# Patient Record
Sex: Female | Born: 1970 | Race: White | Hispanic: No | Marital: Married | Smoking: Current every day smoker
Health system: Southern US, Community
[De-identification: ages and names within clinical notes are randomized; demographics above are authoritative.]

## PROBLEM LIST (undated history)

## (undated) DIAGNOSIS — I10 Essential (primary) hypertension: Secondary | ICD-10-CM

## (undated) DIAGNOSIS — Z720 Tobacco use: Secondary | ICD-10-CM

---

## 2022-01-27 ENCOUNTER — Inpatient Hospital Stay (HOSPITAL_COMMUNITY): Payer: Medicaid Other

## 2022-01-27 ENCOUNTER — Inpatient Hospital Stay (HOSPITAL_COMMUNITY)
Admission: AD | Admit: 2022-01-27 | Discharge: 2022-01-31 | DRG: 915 | Disposition: A | Discharge status: Home / Self Care | Payer: Medicaid Other | Source: Other Acute Inpatient Hospital | Attending: Neurology | Admitting: Neurology

## 2022-01-27 ENCOUNTER — Encounter (HOSPITAL_COMMUNITY): Payer: Self-pay | Admitting: Neurology

## 2022-01-27 DIAGNOSIS — E669 Obesity, unspecified: Secondary | ICD-10-CM | POA: Diagnosis present

## 2022-01-27 DIAGNOSIS — T45615A Adverse effect of thrombolytic drugs, initial encounter: Secondary | ICD-10-CM | POA: Diagnosis present

## 2022-01-27 DIAGNOSIS — Z6831 Body mass index (BMI) 31.0-31.9, adult: Secondary | ICD-10-CM

## 2022-01-27 DIAGNOSIS — Z9189 Other specified personal risk factors, not elsewhere classified: Secondary | ICD-10-CM

## 2022-01-27 DIAGNOSIS — Y848 Other medical procedures as the cause of abnormal reaction of the patient, or of later complication, without mention of misadventure at the time of the procedure: Secondary | ICD-10-CM | POA: Diagnosis present

## 2022-01-27 DIAGNOSIS — Y92239 Unspecified place in hospital as the place of occurrence of the external cause: Secondary | ICD-10-CM | POA: Diagnosis present

## 2022-01-27 DIAGNOSIS — Z9911 Dependence on respirator [ventilator] status: Secondary | ICD-10-CM | POA: Diagnosis not present

## 2022-01-27 DIAGNOSIS — H532 Diplopia: Secondary | ICD-10-CM | POA: Diagnosis present

## 2022-01-27 DIAGNOSIS — J9601 Acute respiratory failure with hypoxia: Secondary | ICD-10-CM | POA: Diagnosis present

## 2022-01-27 DIAGNOSIS — R29810 Facial weakness: Secondary | ICD-10-CM | POA: Diagnosis present

## 2022-01-27 DIAGNOSIS — F1210 Cannabis abuse, uncomplicated: Secondary | ICD-10-CM | POA: Diagnosis present

## 2022-01-27 DIAGNOSIS — I639 Cerebral infarction, unspecified: Secondary | ICD-10-CM | POA: Diagnosis present

## 2022-01-27 DIAGNOSIS — T782XXA Anaphylactic shock, unspecified, initial encounter: Secondary | ICD-10-CM | POA: Diagnosis not present

## 2022-01-27 DIAGNOSIS — T886XXA Anaphylactic reaction due to adverse effect of correct drug or medicament properly administered, initial encounter: Principal | ICD-10-CM | POA: Diagnosis present

## 2022-01-27 DIAGNOSIS — Z720 Tobacco use: Secondary | ICD-10-CM | POA: Diagnosis present

## 2022-01-27 DIAGNOSIS — I952 Hypotension due to drugs: Secondary | ICD-10-CM | POA: Diagnosis present

## 2022-01-27 DIAGNOSIS — J441 Chronic obstructive pulmonary disease with (acute) exacerbation: Secondary | ICD-10-CM | POA: Diagnosis present

## 2022-01-27 DIAGNOSIS — J9600 Acute respiratory failure, unspecified whether with hypoxia or hypercapnia: Secondary | ICD-10-CM

## 2022-01-27 DIAGNOSIS — E039 Hypothyroidism, unspecified: Secondary | ICD-10-CM | POA: Diagnosis present

## 2022-01-27 DIAGNOSIS — E785 Hyperlipidemia, unspecified: Secondary | ICD-10-CM | POA: Diagnosis present

## 2022-01-27 DIAGNOSIS — R262 Difficulty in walking, not elsewhere classified: Secondary | ICD-10-CM | POA: Diagnosis present

## 2022-01-27 DIAGNOSIS — Z8679 Personal history of other diseases of the circulatory system: Secondary | ICD-10-CM

## 2022-01-27 DIAGNOSIS — G9349 Other encephalopathy: Secondary | ICD-10-CM | POA: Diagnosis present

## 2022-01-27 DIAGNOSIS — J9690 Respiratory failure, unspecified, unspecified whether with hypoxia or hypercapnia: Secondary | ICD-10-CM | POA: Diagnosis present

## 2022-01-27 DIAGNOSIS — R4701 Aphasia: Secondary | ICD-10-CM | POA: Diagnosis present

## 2022-01-27 DIAGNOSIS — F329 Major depressive disorder, single episode, unspecified: Secondary | ICD-10-CM | POA: Diagnosis present

## 2022-01-27 DIAGNOSIS — Z886 Allergy status to analgesic agent status: Secondary | ICD-10-CM

## 2022-01-27 DIAGNOSIS — Z888 Allergy status to other drugs, medicaments and biological substances status: Secondary | ICD-10-CM | POA: Diagnosis not present

## 2022-01-27 DIAGNOSIS — I6389 Other cerebral infarction: Secondary | ICD-10-CM | POA: Diagnosis not present

## 2022-01-27 DIAGNOSIS — R29700 NIHSS score 0: Secondary | ICD-10-CM | POA: Diagnosis present

## 2022-01-27 DIAGNOSIS — I10 Essential (primary) hypertension: Secondary | ICD-10-CM | POA: Diagnosis present

## 2022-01-27 DIAGNOSIS — J9621 Acute and chronic respiratory failure with hypoxia: Secondary | ICD-10-CM | POA: Diagnosis not present

## 2022-01-27 DIAGNOSIS — Y829 Unspecified medical devices associated with adverse incidents: Secondary | ICD-10-CM | POA: Diagnosis present

## 2022-01-27 DIAGNOSIS — J449 Chronic obstructive pulmonary disease, unspecified: Secondary | ICD-10-CM | POA: Diagnosis not present

## 2022-01-27 DIAGNOSIS — G463 Brain stem stroke syndrome: Secondary | ICD-10-CM | POA: Diagnosis present

## 2022-01-27 DIAGNOSIS — R42 Dizziness and giddiness: Secondary | ICD-10-CM | POA: Diagnosis not present

## 2022-01-27 HISTORY — DX: Essential (primary) hypertension: I10

## 2022-01-27 HISTORY — DX: Tobacco use: Z72.0

## 2022-01-27 LAB — HEMOGLOBIN A1C
Hgb A1c MFr Bld: 5.5 % (ref 4.8–5.6)
Mean Plasma Glucose: 111.15 mg/dL

## 2022-01-27 LAB — GLUCOSE, CAPILLARY
Glucose-Capillary: 105 mg/dL — ABNORMAL HIGH (ref 70–99)
Glucose-Capillary: 119 mg/dL — ABNORMAL HIGH (ref 70–99)
Glucose-Capillary: 96 mg/dL (ref 70–99)

## 2022-01-27 LAB — LIPID PANEL
Cholesterol: 129 mg/dL (ref 0–200)
HDL: 31 mg/dL — ABNORMAL LOW (ref >40)
LDL Cholesterol: 75 mg/dL (ref 0–99)
Total CHOL/HDL Ratio: 4.2 RATIO
Triglycerides: 113 mg/dL (ref <150)
VLDL: 23 mg/dL (ref 0–40)

## 2022-01-27 LAB — PHOSPHORUS: Phosphorus: 3.6 mg/dL (ref 2.5–4.6)

## 2022-01-27 LAB — MRSA NEXT GEN BY PCR, NASAL: MRSA by PCR Next Gen: NOT DETECTED

## 2022-01-27 LAB — MAGNESIUM: Magnesium: 2.1 mg/dL (ref 1.7–2.4)

## 2022-01-27 IMAGING — DX DG ABD PORTABLE 1V
1 series · 1 of 1 positions shown · non-contrast
Comparison: Chest radiograph same date. No previous comparison
studies.

CLINICAL DATA: Respiratory failure, nasogastric tube present.

EXAM:
PORTABLE ABDOMEN - 1 VIEW

[abdomen]
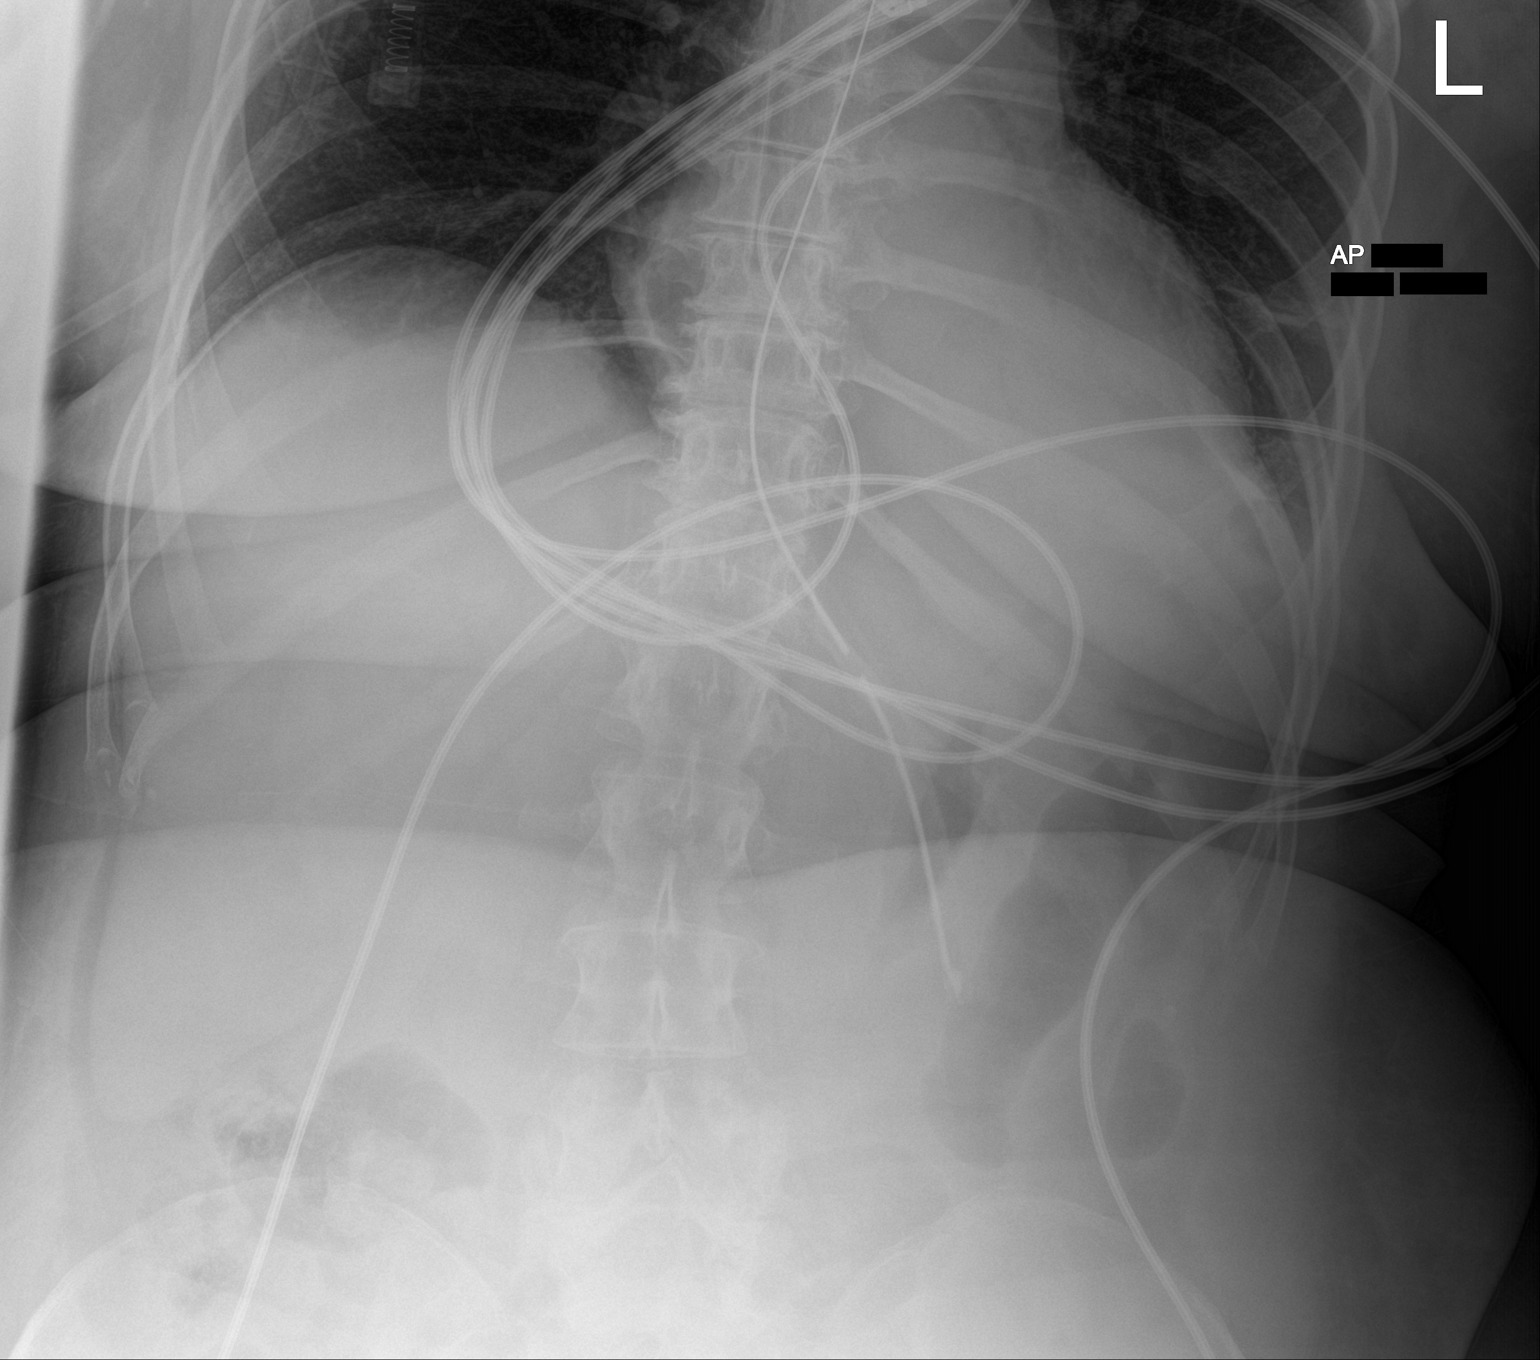

[1 of 1 positions shown; findings below may reference images not displayed]

FINDINGS: [HN] hours. Enteric tube projects over the left mid abdomen
consistent with position in the mid stomach. The visualized bowel
gas pattern is nonobstructive, and there is no evidence of free
intraperitoneal air. As seen on chest radiographs, there is
retrocardiac left lower lobe airspace disease with a possible left
pleural effusion.
IMPRESSION: Enteric tube projects to the level of the mid stomach. Left lower
lobe airspace disease and possible small left pleural effusion.

## 2022-01-27 IMAGING — CT CT HEAD W/O CM
4 series · 16 of 47 positions shown, 18 images · non-contrast
Comparison: None.

CLINICAL DATA: Dizziness and blurry vision, stroke suspected



[Series 2: head wo · axial · 0.39mm/px · z∈[-150,-40]mm · 7 of 30 slices shown, 9 images]
[im 4/30  brain]
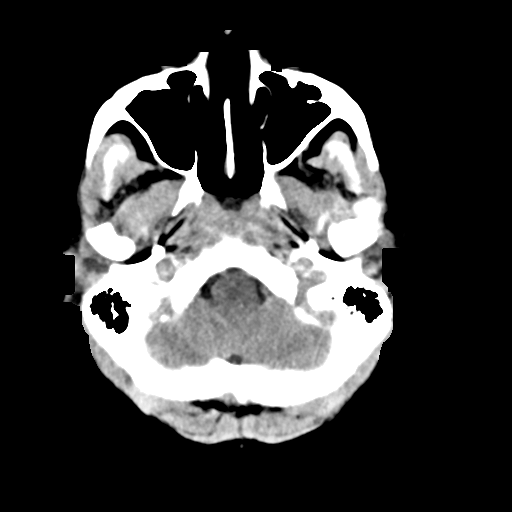
[im 4/30  bone]
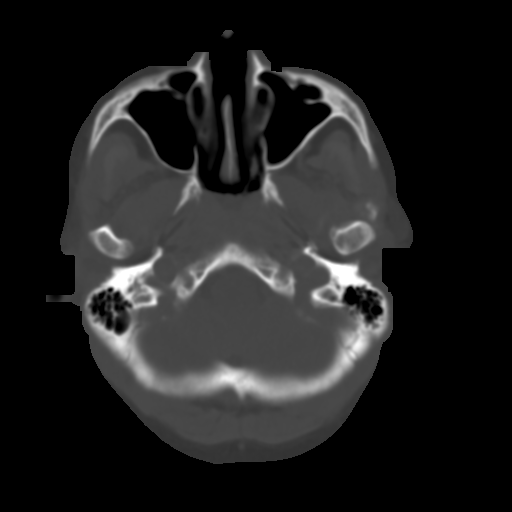
[im 8/30  brain]
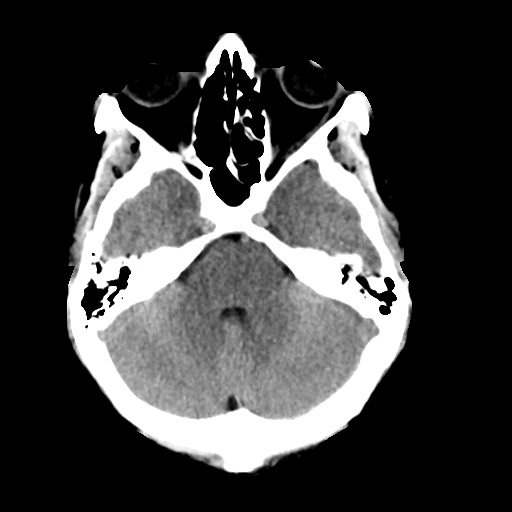
[im 11/30  brain]
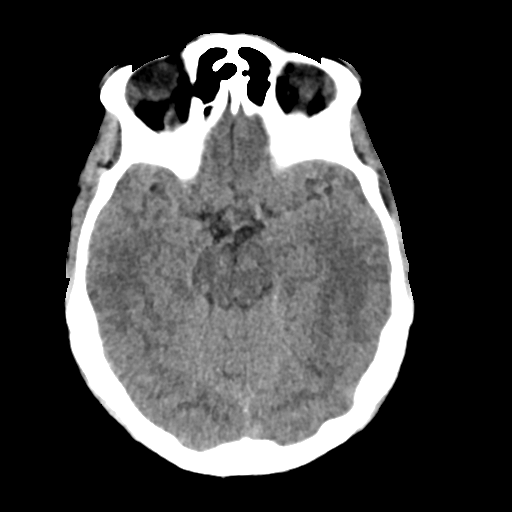
[im 15/30  brain]
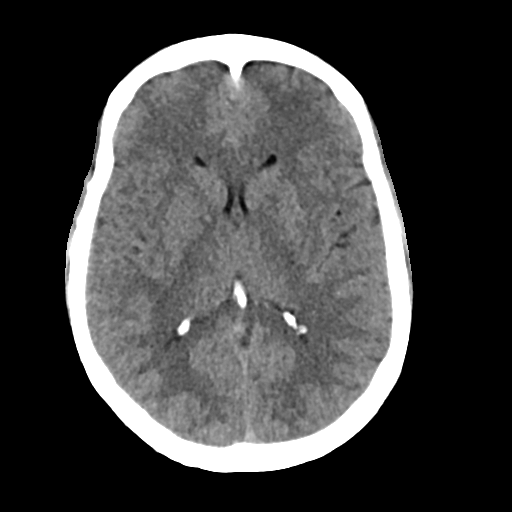
[im 19/30  brain]
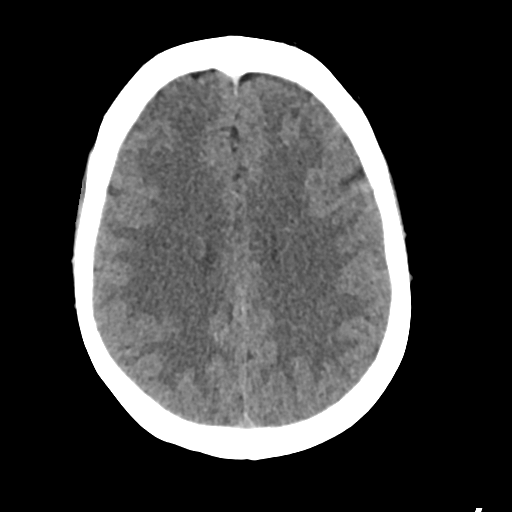
[im 19/30  bone]
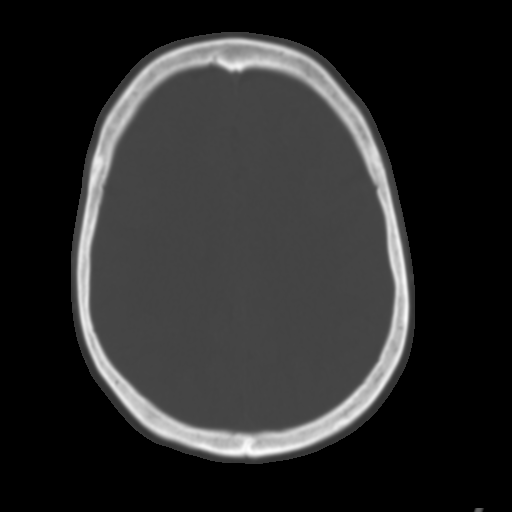
[im 22/30  brain]
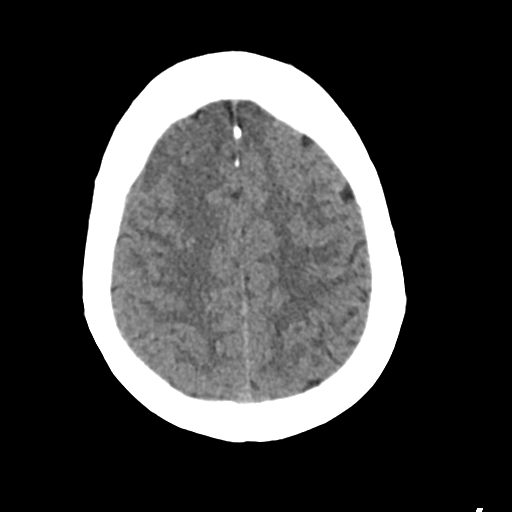
[im 26/30  brain]
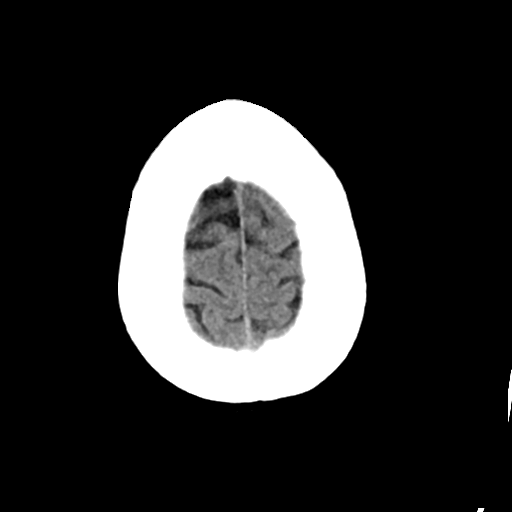

[Series 3: head bone · axial · 0.39mm/px · z∈[-150,-120]mm · 3 of 75 slices shown]
[im 8/75  bone]
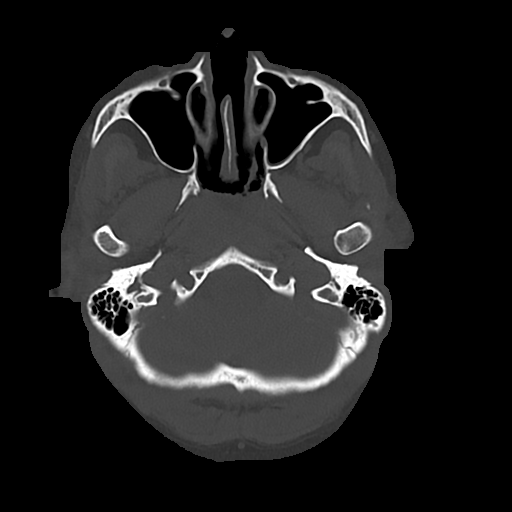
[im 15/75  bone]
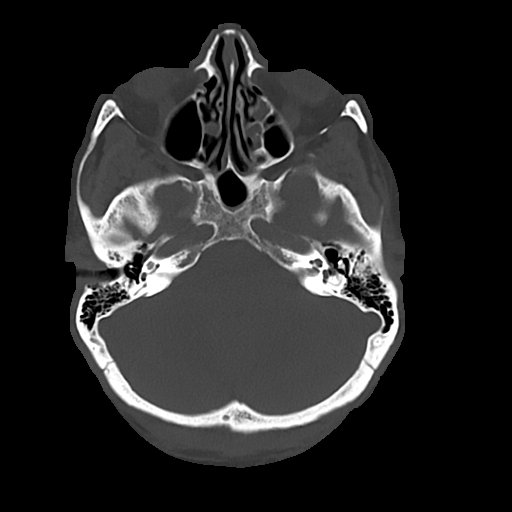
[im 23/75  bone]
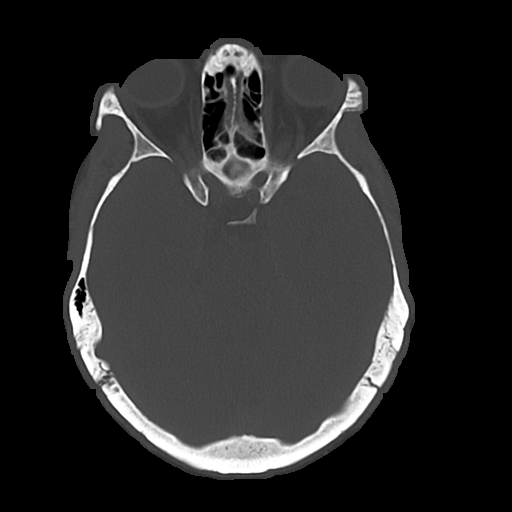

[Series 4: cor soft · coronal · 0.29mm/px · 3 of 64 slices shown]
[im 22/64  brain]
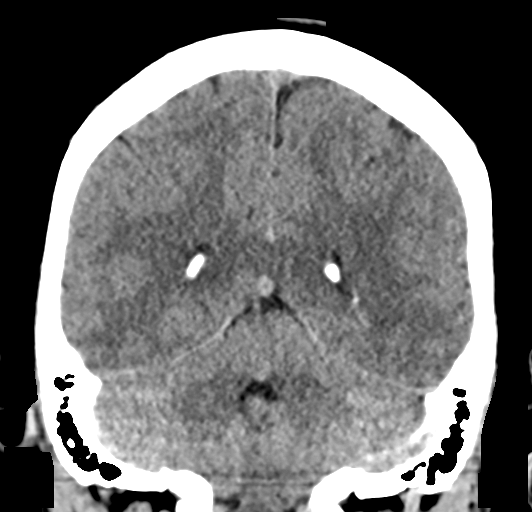
[im 29/64  brain]
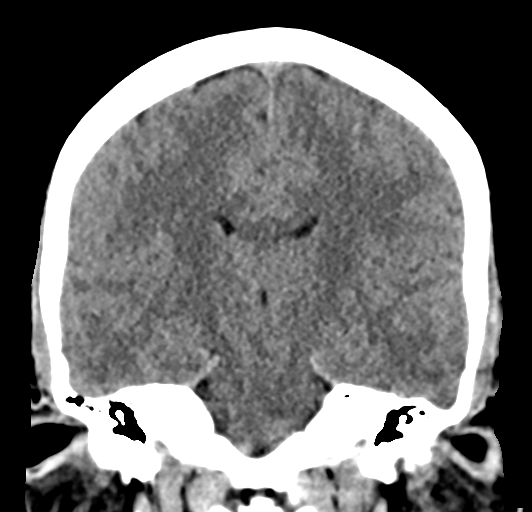
[im 36/64  brain]
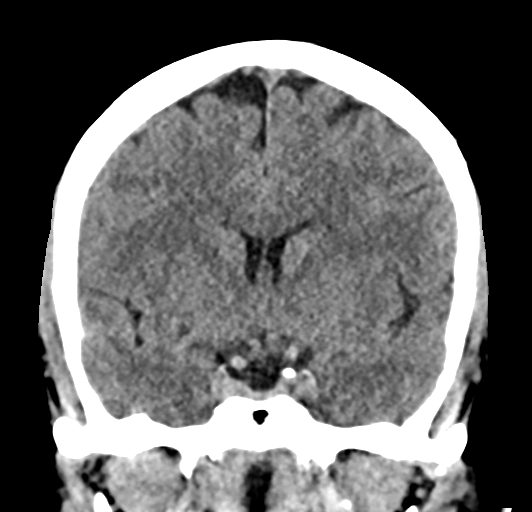

[Series 5: sag soft · sagittal · 0.29mm/px · 3 of 53 slices shown]
[im 18/53  brain]
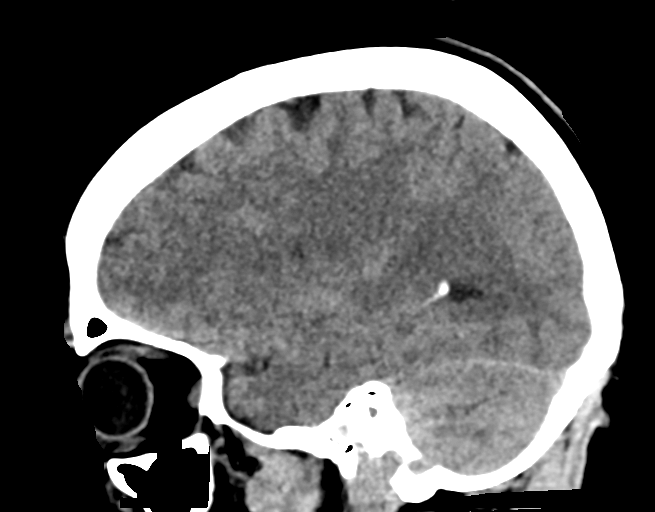
[im 27/53  brain]
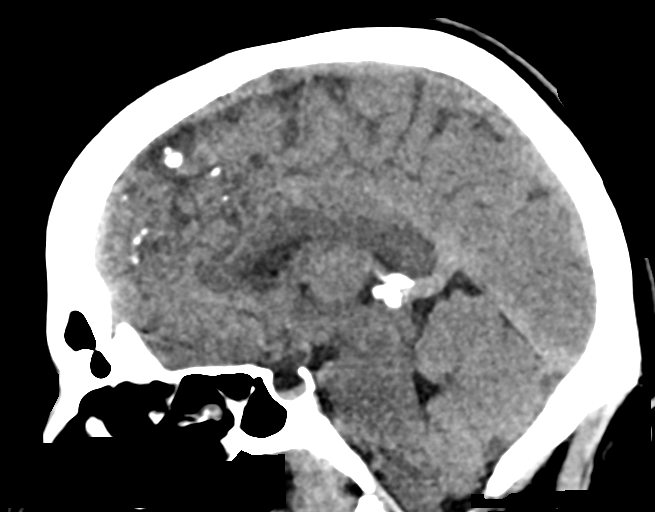
[im 35/53  brain]
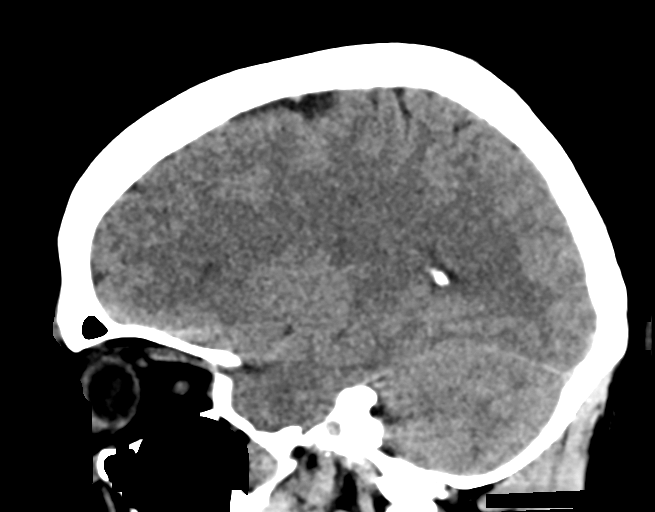

[16 of 47 positions shown; findings below may reference images not displayed]

FINDINGS: Brain: No evidence of acute infarction, hemorrhage, cerebral edema,
mass, mass effect, or midline shift. No hydrocephalus or extra-axial
fluid collection.

Vascular: No hyperdense vessel.

Skull: Normal. Negative for fracture or focal lesion.

Sinuses/Orbits: Minimal mucosal thickening in the right maxillary
sinus and posterior ethmoid air cells. The orbits are unremarkable.

Other: The mastoid air cells are well aerated.
IMPRESSION: IMPRESSION
No acute intracranial process.

## 2022-01-27 IMAGING — DX DG CHEST 1V PORT
1 series · 1 of 1 positions shown · non-contrast
Comparison: None available. Report only from prior chest
radiographs [DATE].

CLINICAL DATA: Respiratory failure.  Nasogastric tube present.

EXAM:
PORTABLE CHEST 1 VIEW

[chest]
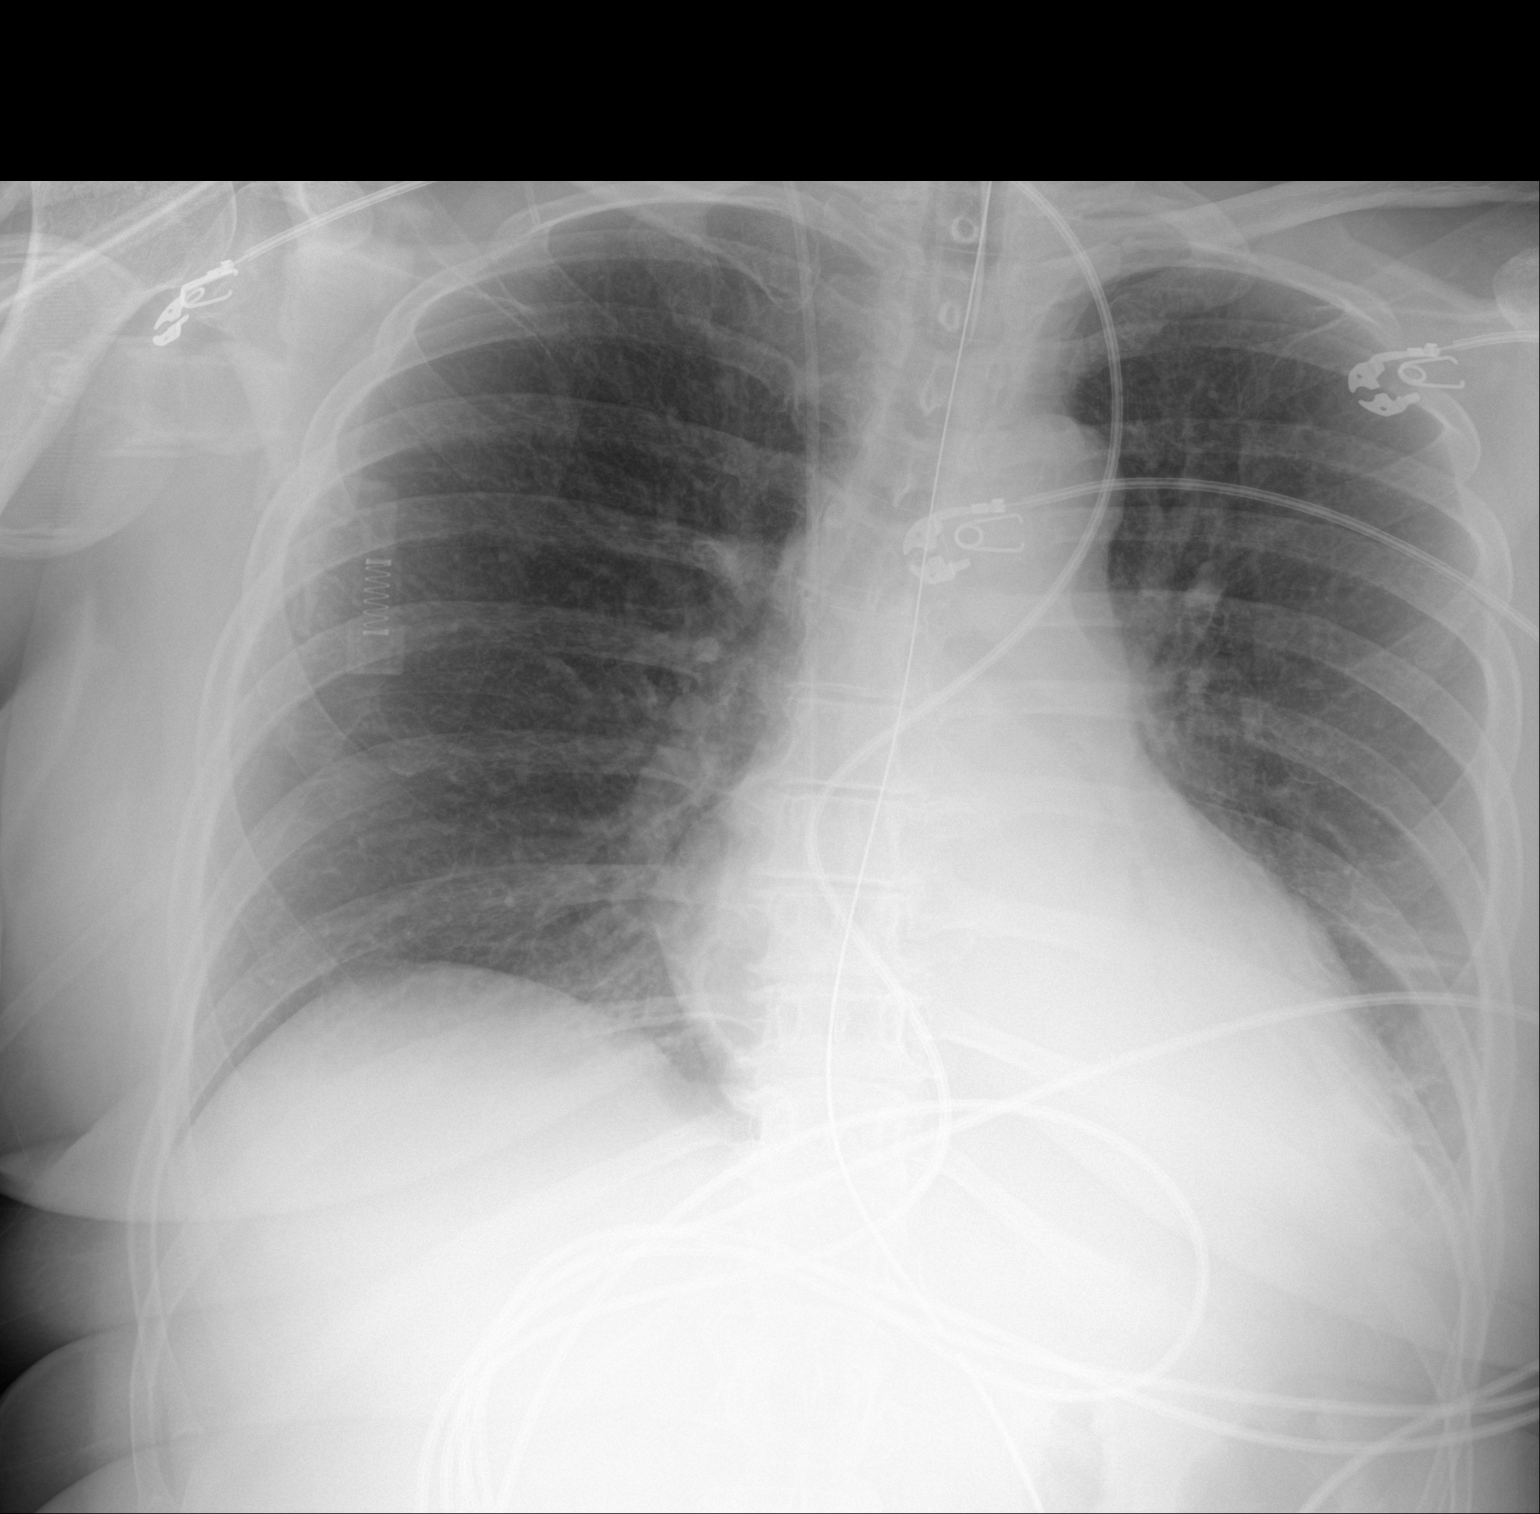

[1 of 1 positions shown; findings below may reference images not displayed]

FINDINGS: [T8] hours. The heart size and mediastinal contours are normal.
Endotracheal tube terminates at the level of the mid trachea.
Enteric tube projects below the diaphragm, with the side hole
projecting over the proximal stomach. There is left lower lobe
airspace disease with associated volume loss and a possible left
pleural effusion. The right lung is clear. No pneumothorax. The
bones appear unremarkable.
IMPRESSION: Satisfactory position of the endotracheal and enteric tubes.

Left lower lobe airspace disease and possible left pleural effusion
suspicious for pneumonia or aspiration. Correlate clinically.
Followup PA and lateral chest X-ray is recommended in 3-4 weeks
following trial of antibiotic therapy to ensure resolution and
exclude underlying malignancy.

## 2022-01-27 MED ORDER — BUDESONIDE 0.25 MG/2ML IN SUSP
0.2500 mg | Freq: Two times a day (BID) | RESPIRATORY_TRACT | Status: DC
  Administered 2022-01-27: 0.25 mg via RESPIRATORY_TRACT
  Filled 2022-01-27 (×4): qty 2

## 2022-01-27 MED ORDER — ONDANSETRON HCL 4 MG/2ML IJ SOLN
4.0000 mg | Freq: Four times a day (QID) | INTRAMUSCULAR | Status: DC
  Administered 2022-01-28 (×2): 4 mg via INTRAVENOUS
  Filled 2022-01-27 (×2): qty 2

## 2022-01-27 MED ORDER — CHLORHEXIDINE GLUCONATE 0.12% ORAL RINSE (MEDLINE KIT)
15.0000 mL | Freq: Two times a day (BID) | OROMUCOSAL | Status: DC
  Administered 2022-01-27 – 2022-01-28 (×2): 15 mL via OROMUCOSAL

## 2022-01-27 MED ORDER — PANTOPRAZOLE 2 MG/ML SUSPENSION: 40.0000 mg | Freq: Every day | ORAL | Status: DC

## 2022-01-27 MED ORDER — VITAL HIGH PROTEIN PO LIQD
1000.0000 mL | ORAL | Status: DC
  Administered 2022-01-27: 1000 mL

## 2022-01-27 MED ORDER — ARFORMOTEROL TARTRATE 15 MCG/2ML IN NEBU
15.0000 ug | INHALATION_SOLUTION | Freq: Two times a day (BID) | RESPIRATORY_TRACT | Status: DC
  Administered 2022-01-27 – 2022-01-29 (×4): 15 ug via RESPIRATORY_TRACT
  Filled 2022-01-27 (×4): qty 2

## 2022-01-27 MED ORDER — REVEFENACIN 175 MCG/3ML IN SOLN
175.0000 ug | Freq: Every day | RESPIRATORY_TRACT | Status: DC
  Administered 2022-01-28 – 2022-01-29 (×2): 175 ug via RESPIRATORY_TRACT
  Filled 2022-01-27 (×2): qty 3

## 2022-01-27 MED ORDER — PROSOURCE TF PO LIQD
45.0000 mL | Freq: Two times a day (BID) | ORAL | Status: DC
  Administered 2022-01-27 – 2022-01-28 (×3): 45 mL
  Filled 2022-01-27 (×3): qty 45

## 2022-01-27 MED ORDER — INSULIN ASPART 100 UNIT/ML IJ SOLN: 0.0000 [IU] | INTRAMUSCULAR | Status: DC

## 2022-01-27 MED ORDER — ASPIRIN 325 MG PO TABS
325.0000 mg | ORAL_TABLET | Freq: Every day | ORAL | Status: DC
  Administered 2022-01-27 – 2022-01-28 (×2): 325 mg
  Filled 2022-01-27 (×3): qty 1

## 2022-01-27 MED ORDER — ROSUVASTATIN CALCIUM 20 MG PO TABS
20.0000 mg | ORAL_TABLET | Freq: Every day | ORAL | Status: DC
  Administered 2022-01-27 – 2022-01-28 (×2): 20 mg
  Filled 2022-01-27 (×2): qty 1

## 2022-01-27 MED ORDER — ALBUTEROL SULFATE (2.5 MG/3ML) 0.083% IN NEBU
2.5000 mg | INHALATION_SOLUTION | RESPIRATORY_TRACT | Status: DC | PRN
  Administered 2022-01-29: 2.5 mg via RESPIRATORY_TRACT
  Filled 2022-01-27: qty 3

## 2022-01-27 MED ORDER — ORAL CARE MOUTH RINSE
15.0000 mL | OROMUCOSAL | Status: DC
  Administered 2022-01-27 – 2022-01-28 (×10): 15 mL via OROMUCOSAL

## 2022-01-27 MED ORDER — ASPIRIN 325 MG PO TABS: 325.0000 mg | ORAL_TABLET | Freq: Every day | ORAL | Status: DC

## 2022-01-27 MED ORDER — ONDANSETRON HCL 4 MG/2ML IJ SOLN
INTRAMUSCULAR | Status: AC
  Administered 2022-01-27: 4 mg via INTRAVENOUS
  Filled 2022-01-27: qty 2

## 2022-01-27 MED ORDER — FENTANYL CITRATE PF 50 MCG/ML IJ SOSY
50.0000 ug | PREFILLED_SYRINGE | INTRAMUSCULAR | Status: DC | PRN
  Administered 2022-01-27: 100 ug via INTRAVENOUS
  Filled 2022-01-27: qty 1
  Filled 2022-01-27: qty 2

## 2022-01-27 MED ORDER — POLYETHYLENE GLYCOL 3350 17 G PO PACK
17.0000 g | PACK | Freq: Every day | ORAL | Status: DC
  Administered 2022-01-27 – 2022-01-28 (×2): 17 g
  Filled 2022-01-27 (×2): qty 1

## 2022-01-27 MED ORDER — NOREPINEPHRINE 16 MG/250ML-% IV SOLN
0.0000 ug/min | INTRAVENOUS | Status: DC
  Administered 2022-01-27: 1 ug/min via INTRAVENOUS
  Filled 2022-01-27: qty 250

## 2022-01-27 MED ORDER — FENTANYL BOLUS VIA INFUSION
50.0000 ug | INTRAVENOUS | Status: DC | PRN
  Administered 2022-01-27 (×2): 50 ug via INTRAVENOUS
  Administered 2022-01-28 (×9): 100 ug via INTRAVENOUS
  Filled 2022-01-27: qty 100

## 2022-01-27 MED ORDER — ROSUVASTATIN CALCIUM 20 MG PO TABS: 20.0000 mg | ORAL_TABLET | Freq: Every day | ORAL | Status: DC

## 2022-01-27 MED ORDER — FENTANYL 2500MCG IN NS 250ML (10MCG/ML) PREMIX INFUSION
50.0000 ug/h | INTRAVENOUS | Status: DC
  Administered 2022-01-27: 50 ug/h via INTRAVENOUS
  Administered 2022-01-28: 200 ug/h via INTRAVENOUS
  Filled 2022-01-27 (×2): qty 250

## 2022-01-27 MED ORDER — FENTANYL CITRATE PF 50 MCG/ML IJ SOSY
50.0000 ug | PREFILLED_SYRINGE | INTRAMUSCULAR | Status: DC | PRN
  Administered 2022-01-27 (×2): 50 ug via INTRAVENOUS
  Filled 2022-01-27: qty 1

## 2022-01-27 MED ORDER — DEXAMETHASONE SODIUM PHOSPHATE 4 MG/ML IJ SOLN: 4.0000 mg | INTRAMUSCULAR | Status: DC

## 2022-01-27 MED ORDER — DOCUSATE SODIUM 50 MG/5ML PO LIQD
100.0000 mg | Freq: Two times a day (BID) | ORAL | Status: DC
  Administered 2022-01-27 – 2022-01-28 (×2): 100 mg
  Filled 2022-01-27 (×2): qty 10

## 2022-01-27 MED ORDER — CHLORHEXIDINE GLUCONATE CLOTH 2 % EX PADS
6.0000 | MEDICATED_PAD | Freq: Every day | CUTANEOUS | Status: DC
  Administered 2022-01-27 – 2022-01-29 (×3): 6 via TOPICAL

## 2022-01-27 MED ORDER — DEXMEDETOMIDINE HCL IN NACL 400 MCG/100ML IV SOLN
0.0000 ug/kg/h | INTRAVENOUS | Status: DC
  Administered 2022-01-27: 0.4 ug/kg/h via INTRAVENOUS
  Filled 2022-01-27: qty 100

## 2022-01-27 MED ORDER — PANTOPRAZOLE 2 MG/ML SUSPENSION
40.0000 mg | Freq: Every day | ORAL | Status: DC
  Administered 2022-01-27 – 2022-01-28 (×2): 40 mg
  Filled 2022-01-27 (×2): qty 20

## 2022-01-27 NOTE — Progress Notes (Addendum)
1830: Patient with emesis x3. NP Anders Simmonds and MD Roda Shutters made aware. Pt coughing significantly. Pt's neuro exam unchanged. Zofran requested. Tube Feeds turned off. See MAR. ? ?1900: 2x emesis episodes. MD Roda Shutters made aware. Pt's neuro exam continues to be the same, alert, and attempting to talk to RN. Stat CT ordered for now and will defer MRI to later point in time per MD Roda Shutters. NP Lorin Picket with CCM brought to bedside to evaluate patient. Request for more sedation. See MAR.  ?

## 2022-01-27 NOTE — H&P (Signed)
? ?NAME:  Angel Carroll, MRN:  [id number], DOB:  1971-06-25, LOS: 0 ?ADMISSION DATE:  01/27/2022, CONSULTATION DATE:  01/27/22 ?REFERRING MD:  Erlinda Hong CHIEF COMPLAINT:  Respiratory failure  ? ?History of Present Illness:  ?History provided by patient's daughter. On 4/27 around 5 pm, patient called her daughter reporting dizziness and blurry vision. Daughter states that patient seemed out of it, couldn't remember what her birthday was. Daughter drove patient to ED. [facility] tele-stroke evaluated the patient and found her to have R arm numbness and difficulty ambulating. Initial CT head negative, tPA administered and patient was taken to ICU for monitoring. ? ?3 hours after administration, patient developed increasing swelling of tongue, difficulty swallowing, worsening numbness and weakness. She was subsequently intubated. She became hypotensive to MAP <65, received fluids and levophed and 1 dose of atropine for bradycardia.  ? ?Pertinent  Medical History  ?HTN ?Hypothyroidism ?Current smoker ? ?Significant Hospital Events: ?Including procedures, antibiotic start and stop dates in addition to other pertinent events   ? ? ?Interim History / Subjective:  ?On arrival, patient is intubated and sedated. Responds to voice and moves all extremities spontaneously.  ? ?Objective   ?Blood pressure (!) 159/91, pulse 82, temperature 100.3 ?F (37.9 ?C), temperature source Axillary, resp. rate 18, weight 75.7 kg, SpO2 100 %. ?   ?Vent Mode: PRVC ?FiO2 (%):  [100 %] 100 % ?Set Rate:  [18 bmp] 18 bmp ?Vt Set:  [460 mL] 460 mL ?PEEP:  [5 cmH20] 5 cmH20 ?Plateau Pressure:  [19 cmH20] 19 cmH20  ?No intake or output data in the 24 hours ending 01/27/22 1640 ?Filed Weights  ? 01/27/22 1434  ?Weight: 75.7 kg  ? ? ?Examination: ?General: Intubated and sedated, appears stable.  ?Lungs: Diffusing wheezes throughout anterior lung fields. ?Cardiovascular: Regular rate and rhythm. ?Abdomen: Soft, non-distended, non-tender. ?Extremities: Edema and  erythema of R hand at IV site. No lower extremity edema. ?Neuro: Exam limited by patient sedation. Responsive to voice. Pupils small and equal. Extraoccular movements appear intact. Unable to turn to left side. Strength 3/5 on R, 2/5 on L.  ? ?Resolved Hospital Problem list   ? ?Assessment & Plan:  ? ?# Acute respiratory failure ?Patient presents with acute respiratory failure in the setting of CVA and tPA administration. She is currently sating well on 100% FiO2. Exam significant for wheeze throughout lung fields. Differential for respiratory failure includes adverse reaction to tPA (angioedema or anaphylaxis) vs inability to handle secretions due to stroke. Wheezing on exam is suggestive of anaphylaxis, however she may have underlying COPD given smoking history. Angioedema is a more common adverse reaction of tPA. Plan to treat swelling with steroids and provide bronchodilators for potential underlying COPD.  ?- f/u CXR, abd XR ?- sedation with fentanyl ?- levophed for bp support ?- brovana 15 mcg BID, budesonide 0.25 mg BID, revefenacin 175 mcg daily, albuterol 2.5 mg every 4 hours PRN ?- decadron 4 mg IV once a day for 3 days ?- if swelling worsens/ does not improve, can consider epinephrine ? ?# Stroke ?Patient presented with dizziness, double vision, trouble walking consistent with basilar stroke. S/p TPA at 2100 4/27. Will plan to obtain MRI brain at 24 hours post tPA, screen for risk factors and treat with secondary prevention. ?- f/u MRI brain ?- f/u A1c, lipid panel  ?- ASA 81 ?- rosuvastatin 20 mg daily ?- frequent neuro checks ? ?Best Practice (right click and "Reselect all SmartList Selections" daily)  ? ?Diet/type: tubefeeds ?DVT prophylaxis: not indicated ?  GI prophylaxis: PPI ?Lines: Central line ?Foley:  Yes, and it is still needed ?Code Status:  full code ?Last date of multidisciplinary goals of care discussion [4/28] ? ?Labs   ?CBC: ?No results for input(s): WBC, NEUTROABS, HGB, HCT, MCV, PLT in  the last 168 hours. ? ?Basic Metabolic Panel: ?No results for input(s): NA, K, CL, CO2, GLUCOSE, BUN, CREATININE, CALCIUM, MG, PHOS in the last 168 hours. ?GFR: ?CrCl cannot be calculated (No successful lab value found.). ?No results for input(s): PROCALCITON, WBC, LATICACIDVEN in the last 168 hours. ? ?Liver Function Tests: ?No results for input(s): AST, ALT, ALKPHOS, BILITOT, PROT, ALBUMIN in the last 168 hours. ?No results for input(s): LIPASE, AMYLASE in the last 168 hours. ?No results for input(s): AMMONIA in the last 168 hours. ? ?ABG ?No results found for: PHART, PCO2ART, PO2ART, HCO3, TCO2, ACIDBASEDEF, O2SAT  ? ?Coagulation Profile: ?No results for input(s): INR, PROTIME in the last 168 hours. ? ?Cardiac Enzymes: ?No results for input(s): CKTOTAL, CKMB, CKMBINDEX, TROPONINI in the last 168 hours. ? ?HbA1C: ?No results found for: HGBA1C ? ?CBG: ?No results for input(s): GLUCAP in the last 168 hours. ? ?Review of Systems:   ?Unable to obtain ? ?Past Medical History:  ?She,  has a past medical history of Hypertension and Tobacco abuse.  ? ?Surgical History:  ?None ? ?Social History:  ?   ? ?Family History:  ?Her family history is not on file.  ? ?Allergies ?Allergies  ?Allergen Reactions  ? T-Pa [Alteplase] Anaphylaxis  ? Corticosteroids   ?  Unknown   ? Nsaids   ?  Unknown   ?  ? ?Home Medications  ?Prior to Admission medications   ?Not on File  ?  ? ?Critical care time: 40 minutes ?  ? ? ?Excell Seltzer, MS4 ?  ?

## 2022-01-27 NOTE — Progress Notes (Signed)
Transported Pt to CT on vent. PT back in room on vent. No complications.  ?

## 2022-01-27 NOTE — Consult Note (Addendum)
NEUROLOGY CONSULTATION NOTE  ? ?Date of service: January 27, 2022 ?Patient Name: Angel Carroll ?MRN:  [id number] ?DOB:  [birth year] ?Reason for consult: "Stroke management s/p tPA and anaphylaxis" ?Requesting Provider: Rosalin Hawking, MD ? ?History of Present Illness  ?[name] is a 51 y.o. female with PMH HTN, HLD, tobacco abuse, cannabis abuse, MDD who presented to [place] (01/27/2022) a transfer from [place] for management of anaphylaxis to tPA and continued stroke management. ? ?Family at bedside. ? ?01/26/2022, patient had acute onset of dizziness, double vision, confusion, headache, expressive aphasia and promptly called daughter at 5:23pm. RN received sign off saying patient's NIHSS 0 on ED arrival. Per daughter, in the ED, patient had right-sided numbness, right-sided weakness, difficulties identifying the right side of pictures in neglect testing, expressive aphasia, disorientation. ?[facility] consulted [facility], which commended tPA administration.  Patient received tPA at 20:55, with initially improving symptoms, however at 23:10 began to have oral-facial swelling with respiratory compromise.  As intubated, became hypotensive requiring pressors and fluids.  Are consistent with anaphylaxis, however due to family's persistence on not giving patient steroids due to past side effects, patient received Benadryl 25 mg and 50 mg to poor effect.  Patient eventually hemodynamically stabilized, but still requiring intubation. ? ?Family reported that patient has had more frequent falls within the last few months, complained of chest pain last few days.  Possible concerns of restless leg, and waking up multiple times to use the restroom.  Unable to provide further details given mom is quite secretive with her symptoms, to prevent family concern.  ? ?At baseline patient ambulates without assistance. ? ?Family has concerned about high dose steroids, as in the past patient has had sxs of impending doom.  After discussion with family, if patient's swelling is limiting extubation tomorrow, to please call family for update and may consider decadron for swelling.  ? ?LNK: ? ?tPA: 20:55 01/26/2022 at hospital in [place]  ?NIHSS: ? ? ?ROS  ? ?Intubated  ? ?Past History  ? ?Past Medical History:  ?Diagnosis Date  ? Hypertension   ? Tobacco abuse   ? ?The histories are not reviewed yet. Please review them in the "History" navigator section and refresh this [place]. ?No family history on file. ?Social History  ? ?Socioeconomic History  ? Marital status: Not on file  ?  Spouse name: Not on file  ? Number of children: Not on file  ? Years of education: Not on file  ? Highest education level: Not on file  ?Occupational History  ? Not on file  ?Tobacco Use  ? Smoking status: Not on file  ? Smokeless tobacco: Not on file  ?Substance and Sexual Activity  ? Alcohol use: Not on file  ? Drug use: Not on file  ? Sexual activity: Not on file  ?Other Topics Concern  ? Not on file  ?Social History Narrative  ? Not on file  ? ?Social Determinants of Health  ? ?Financial Resource Strain: Not on file  ?Food Insecurity: Not on file  ?Transportation Needs: Not on file  ?Physical Activity: Not on file  ?Stress: Not on file  ?Social Connections: Not on file  ? ?Allergies  ?Allergen Reactions  ? T-Pa [Alteplase] Anaphylaxis  ? Corticosteroids   ?  Unknown   ? Nsaids   ?  Unknown   ? ? ?Medications  ? ?No medications prior to admission.  ?  ? ?Vitals  ? ?Vitals:  ? 01/27/22 1434  ?BP: 129/64  ?Pulse: 80  ?  Resp: 18  ?Temp: 100.3 ?F (37.9 ?C)  ?TempSrc: Axillary  ?SpO2: 100%  ?Weight: 75.7 kg  ?  ? ?There is no height or weight on file to calculate BMI. ? ?Physical Exam  ?General: Intubated, would gag on tube, eyes closed, head turned to right ?Pulmonary: Intubated ?Ext: edema BUE ?Musculoskeletal: Normal digits and nails by inspection. No clubbing.  ? ?Neurologic Examination  ?Mental status/Cognition:  ?Drowsy and sedated on fentanyl,  received versed 2hrs prior exam.  ?Speech/language:  ?Comprehension intact-able to follow 1 step commands ? ?Cranial Nerves: ?II: Inconsistent blinks to threat ?III,IV, VI: Pinpoint pupils, difficult to appreciate pupillary reflex. Rightward gaze preference, can pass midline. Ptosis not present, EOMI bilaterally. No nystagmus  ?V: Facial sensation grossly intact  ?VII: Symmetric grimace ?VIII: Hearing normal bilaterally ?IX,X: Cough intact. Gag intact.  ?XI: Turning head leftward limited due to pain ?XII: Intubated ?Motor: ?BUE - antigravity, symmetric ?BLE - symmetric motions, cannot hold against gravity ?Normal tone throughout.  ?Normal bulk throughout.  ?No atrophy noted ? ?Sensory:  ?BLE grossly intact ?RUE intact. LUE decreased sensation ? ?Reflexes and Coordination/Complex Motor:  ?Deferred for now ? ?Labs  ? ?CBC: No results for input(s): WBC, NEUTROABS, HGB, HCT, MCV, PLT in the last 168 hours. ? ?Basic Metabolic Panel: No results found for: NA, K, CO2, GLUCOSE, BUN, CREATININE, CALCIUM, GFRNONAA, GFRAA, GLUCOSE ?Lipid Panel: No results found for: [place] ?HgbA1c: No results found for: HGBA1C ?Urine Drug Screen: No results found for: LABOPIA, COCAINSCRNUR, [place], [place], THCU, LABBARB  ?Alcohol Level No results found for: ETH ?No results found. ? ?Post tPA MRI ?Ordered ? ?Impression  ?Angel Carroll is a 51 y.o. female with PMH HTN, HLD, tobacco abuse, cannabis abuse, MDD who presented to [place] (01/27/2022) a transfer from [place] for management of anaphylaxis to tPA and continued stroke management. ? ?tPA allergy with shock. Currently hemodynamically stable, however still has facial swelling will continue to keep intubated. Stroke workup.  ?Suspect left sided infarct, pending MRI confirmation and bleed assessment. ? ?Recommendations  ?No Asa 325mg  for 24hr s/p tPA ?MRI brain wo con ~24hr s/p tPA ?CTA Head & Neck wo con (MRA Head and neck if kidneys) ?Echo ?Lipid panel - statin if LDL >70 ?A1c -  goal <7 ?Permissive HTN for 1-2 days but gradually normalize in 5-7 days. Goal normotensive long term.  ?Post tNK: < 180/105 for first 24hrs ?Telemetry monitoring for arrhythmia ?Frequent neuro checks ?PT, OT, SLP  ?Sleep study referral  ?Smoking cessation ? ?Signed: ?Merrily Brittle, DO ?Resident, PGY-1 ?[facility] ?01/27/2022, 3:36 PM   ? ?ATTENDING NOTE: ?I reviewed above note and agree with the assessment and plan. Pt was seen and examined.  ? ?51 year old female with history of hypertension, hyperlipidemia, smoker transferred from [facility] after tPA and intubated for tPA allergic reaction with tongue and pharyngeal swelling as well as bradycardia and hypotension.  tPA was given 9 PM last night.  Currently patient intubated with intermittent fentanyl push for sedation, patient was able to open eyes with repetitive stimulation, not quite follow commands, not consistently blinking to visual threat bilaterally, eyes midline, no significant facial droop.  Moving extremities symmetrically bilaterally. ? ?CCM on board for vent management and hypotension, currently on Levophed, BP 100s. Will check MRI and MRA around 24h of tPA, stroke work up with LDL, A1C, CUS and TTE. Continue statin. Will add antiplatelet once MRI no bleeding.  ? ?For detailed assessment and plan, please refer to above as I have made changes  wherever appropriate.  ? ?Rosalin Hawking, MD PhD ?Stroke Neurology ?01/27/2022 ?8:48 PM ? ?This patient is critically ill due to possible stroke status post tPA, tPA anaphylaxis reaction with hypotension, bradycardia and pharyngeal edema needing intubation and at significant risk of neurological worsening, death form recurrent stroke, hemorrhagic transformation, bleeding from tPA, respiratory failure. This patient's care requires constant monitoring of vital signs, hemodynamics, respiratory and cardiac monitoring, review of multiple databases, neurological assessment, discussion with family, other  specialists and medical decision making of high complexity. I spent 35 minutes of neurocritical care time in the care of this patient. ? ? ? ?

## 2022-01-28 ENCOUNTER — Encounter (HOSPITAL_COMMUNITY): Payer: Self-pay

## 2022-01-28 ENCOUNTER — Inpatient Hospital Stay (HOSPITAL_COMMUNITY): Payer: Medicaid Other

## 2022-01-28 DIAGNOSIS — J9601 Acute respiratory failure with hypoxia: Secondary | ICD-10-CM

## 2022-01-28 DIAGNOSIS — G463 Brain stem stroke syndrome: Secondary | ICD-10-CM | POA: Diagnosis present

## 2022-01-28 LAB — CBC
HCT: 40.6 % (ref 36.0–46.0)
Hemoglobin: 13.1 g/dL (ref 12.0–15.0)
MCH: 28.5 pg (ref 26.0–34.0)
MCHC: 32.3 g/dL (ref 30.0–36.0)
MCV: 88.3 fL (ref 80.0–100.0)
Platelets: 235 10*3/uL (ref 150–400)
RBC: 4.6 MIL/uL (ref 3.87–5.11)
RDW: 12.8 % (ref 11.5–15.5)
WBC: 17.5 10*3/uL — ABNORMAL HIGH (ref 4.0–10.5)
nRBC: 0 % (ref 0.0–0.2)

## 2022-01-28 LAB — BASIC METABOLIC PANEL
Anion gap: 7 (ref 5–15)
BUN: 8 mg/dL (ref 6–20)
CO2: 23 mmol/L (ref 22–32)
Calcium: 8 mg/dL — ABNORMAL LOW (ref 8.9–10.3)
Chloride: 108 mmol/L (ref 98–111)
Creatinine, Ser: 0.81 mg/dL (ref 0.44–1.00)
GFR, Estimated: >60 mL/min (ref >60)
Glucose, Bld: 114 mg/dL — ABNORMAL HIGH (ref 70–99)
Potassium: 3.5 mmol/L (ref 3.5–5.1)
Sodium: 138 mmol/L (ref 135–145)

## 2022-01-28 LAB — GLUCOSE, CAPILLARY
Glucose-Capillary: 116 mg/dL — ABNORMAL HIGH (ref 70–99)
Glucose-Capillary: 112 mg/dL — ABNORMAL HIGH (ref 70–99)
Glucose-Capillary: 115 mg/dL — ABNORMAL HIGH (ref 70–99)
Glucose-Capillary: 105 mg/dL — ABNORMAL HIGH (ref 70–99)
Glucose-Capillary: 98 mg/dL (ref 70–99)

## 2022-01-28 LAB — POCT I-STAT 7, (LYTES, BLD GAS, ICA,H+H)
Acid-base deficit: 1 mmol/L (ref 0.0–2.0)
Bicarbonate: 24.4 mmol/L (ref 20.0–28.0)
Calcium, Ion: 1.24 mmol/L (ref 1.15–1.40)
HCT: 39 % (ref 36.0–46.0)
Hemoglobin: 13.3 g/dL (ref 12.0–15.0)
O2 Saturation: 98 %
Patient temperature: 98.7
Potassium: 3.6 mmol/L (ref 3.5–5.1)
Sodium: 138 mmol/L (ref 135–145)
TCO2: 26 mmol/L (ref 22–32)
pCO2 arterial: 44.1 mmHg (ref 32–48)
pH, Arterial: 7.35 (ref 7.35–7.45)
pO2, Arterial: 121 mmHg — ABNORMAL HIGH (ref 83–108)

## 2022-01-28 LAB — MAGNESIUM: Magnesium: 1.8 mg/dL (ref 1.7–2.4)

## 2022-01-28 LAB — PHOSPHORUS: Phosphorus: 3.4 mg/dL (ref 2.5–4.6)

## 2022-01-28 IMAGING — DX DG CHEST 1V PORT
1 series · 1 of 1 positions shown · non-contrast
Comparison: Radiograph yesterday.

CLINICAL DATA: Respirator dependent. Evaluate airspace disease,
pleural effusion, pneumonia or aspiration.

EXAM:
PORTABLE CHEST 1 VIEW

[chest ap]
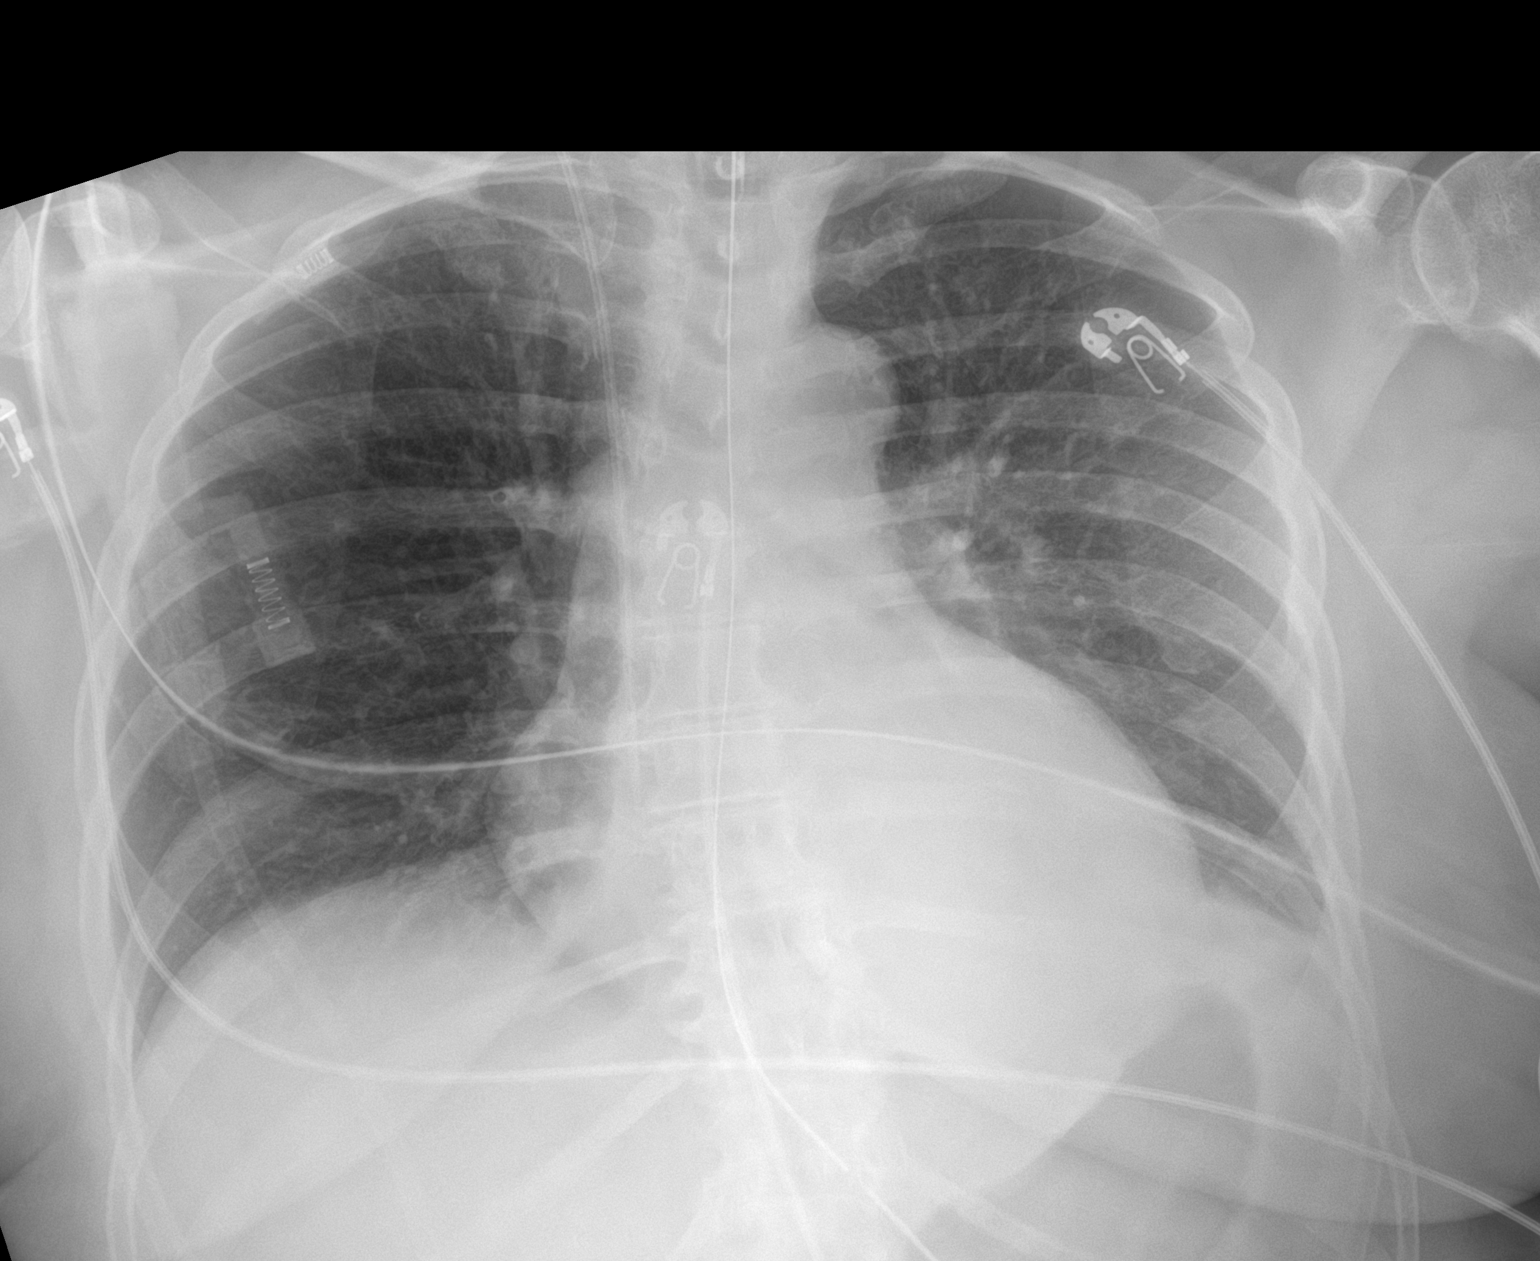

[1 of 1 positions shown; findings below may reference images not displayed]

FINDINGS: Endotracheal tube tip at the level of the clavicular heads, 6.7 cm
from the carina, borderline high in positioning. Tip and side port
of the enteric tube in the stomach. Right internal jugular central
line remains in place. Persistent left lower lobe airspace disease.
There may be slight improvement from yesterday. Possible small left
pleural effusion again seen. No progressive consolidation. No
pneumothorax.
IMPRESSION: 1. Endotracheal tube tip at the level of the clavicular heads,
cm from the carina, borderline high in positioning.
2. Persistent left lower lobe airspace disease and possible small
pleural effusion. Slight improvement from yesterday.

## 2022-01-28 MED ORDER — NICOTINE 14 MG/24HR TD PT24
14.0000 mg | MEDICATED_PATCH | Freq: Every day | TRANSDERMAL | Status: DC
  Administered 2022-01-29 – 2022-01-31 (×3): 14 mg via TRANSDERMAL
  Filled 2022-01-28 (×4): qty 1

## 2022-01-28 MED ORDER — TRAZODONE HCL 50 MG PO TABS
150.0000 mg | ORAL_TABLET | Freq: Every evening | ORAL | Status: DC | PRN
  Administered 2022-01-28 – 2022-01-30 (×3): 150 mg via ORAL
  Filled 2022-01-28: qty 3
  Filled 2022-01-28 (×2): qty 1

## 2022-01-28 MED ORDER — BUPROPION HCL 75 MG PO TABS
150.0000 mg | ORAL_TABLET | Freq: Two times a day (BID) | ORAL | Status: DC
Start: 2022-01-29 — End: 2022-01-31
  Administered 2022-01-29: 150 mg via ORAL
  Filled 2022-01-28 (×7): qty 2

## 2022-01-28 MED ORDER — VITAL AF 1.2 CAL PO LIQD: 1000.0000 mL | ORAL | Status: DC

## 2022-01-28 MED ORDER — ONDANSETRON HCL 4 MG/2ML IJ SOLN
4.0000 mg | Freq: Four times a day (QID) | INTRAMUSCULAR | Status: DC | PRN
  Administered 2022-01-28 – 2022-01-30 (×3): 4 mg via INTRAVENOUS
  Filled 2022-01-28 (×4): qty 2

## 2022-01-28 MED ORDER — ACETAMINOPHEN 325 MG PO TABS
650.0000 mg | ORAL_TABLET | Freq: Four times a day (QID) | ORAL | Status: DC | PRN
  Administered 2022-01-28 – 2022-01-29 (×3): 650 mg via ORAL
  Filled 2022-01-28 (×3): qty 2

## 2022-01-28 MED ORDER — PROPOFOL 1000 MG/100ML IV EMUL: 5.0000 ug/kg/min | INTRAVENOUS | Status: DC

## 2022-01-28 MED ORDER — BUPROPION HCL 75 MG PO TABS
150.0000 mg | ORAL_TABLET | Freq: Two times a day (BID) | ORAL | Status: DC
  Filled 2022-01-28: qty 2

## 2022-01-28 MED ORDER — ENOXAPARIN SODIUM 40 MG/0.4ML IJ SOSY
40.0000 mg | PREFILLED_SYRINGE | INTRAMUSCULAR | Status: DC
  Administered 2022-01-30 – 2022-01-31 (×2): 40 mg via SUBCUTANEOUS
  Filled 2022-01-28 (×3): qty 0.4

## 2022-01-28 MED ORDER — PHENOL 1.4 % MT LIQD
1.0000 | OROMUCOSAL | Status: DC | PRN
  Filled 2022-01-28: qty 177

## 2022-01-28 MED ORDER — PANTOPRAZOLE SODIUM 40 MG PO TBEC
40.0000 mg | DELAYED_RELEASE_TABLET | Freq: Every day | ORAL | Status: DC
  Filled 2022-01-28 (×2): qty 1

## 2022-01-28 MED ORDER — ROSUVASTATIN CALCIUM 20 MG PO TABS
20.0000 mg | ORAL_TABLET | Freq: Every day | ORAL | Status: DC
  Administered 2022-01-29 – 2022-01-31 (×3): 20 mg via ORAL
  Filled 2022-01-28 (×3): qty 1

## 2022-01-28 MED ORDER — ATROPINE SULFATE 1 MG/ML IV SOLN
INTRAVENOUS | Status: AC
  Filled 2022-01-28: qty 1

## 2022-01-28 NOTE — Procedures (Signed)
Extubation Procedure Note ? ?Patient Details:   ?Name: Angel Carroll ?DOB: 12-25-1970 ?MRN: [id number] ?  ?Airway Documentation:  ?  ?Vent end date: 01/28/22 Vent end time: 1300  ? ?Evaluation ? O2 sats: stable throughout ?Complications: No apparent complications ?Patient did tolerate procedure well. ?Bilateral Breath Sounds: Diminished, Clear ?  ?Yes ? ?Pt extubated to [place] with RN at bedside. Positive cuff leak noted and pt is tolerating well. RT will monitor. ? ?Cathie Olden ?01/28/2022, 1:15 PM ? ?

## 2022-01-28 NOTE — H&P (Signed)
? ?NAME:  Angel Carroll, MRN:  [id number], DOB:  [birth year], LOS: 1 ?ADMISSION DATE:  01/27/2022, CONSULTATION DATE:  01/27/22 ?REFERRING MD:  Erlinda Hong CHIEF COMPLAINT:  Respiratory failure  ? ?History of Present Illness:  ?History provided by patient's daughter. On 4/27 around 5 pm, patient called her daughter reporting dizziness and blurry vision. Daughter states that patient seemed out of it, couldn't remember what her birthday was. Daughter drove patient to ED. [facility] tele-stroke evaluated the patient and found her to have R arm numbness and difficulty ambulating. Initial CT head negative, tPA administered and patient was taken to ICU for monitoring. ? ?3 hours after administration, patient developed increasing swelling of tongue, difficulty swallowing, worsening numbness and weakness. She was subsequently intubated. She became hypotensive to MAP <65, received fluids and levophed and 1 dose of atropine for bradycardia.  ? ?Pertinent  Medical History  ?HTN ?Hypothyroidism ?Current smoker ? ?Significant Hospital Events: ?Including procedures, antibiotic start and stop dates in addition to other pertinent events   ?4/29 received in transfer from [facility] ? ?Interim History / Subjective:  ?Patient is awake and following commands.  Attempting to speak around ET tube. ? ?Objective   ?Blood pressure 127/77, pulse (!) 50, temperature 99 ?F (37.2 ?C), temperature source Axillary, resp. rate 11, weight 75.7 kg, SpO2 97 %. ?   ?Vent Mode: PSV;CPAP ?FiO2 (%):  [40 %-100 %] 40 % ?Set Rate:  [1 bmp-18 bmp] 18 bmp ?Vt Set:  [460 mL] 460 mL ?PEEP:  [5 cmH20] 5 cmH20 ?Pressure Support:  [10 cmH20] 10 cmH20 ?Plateau Pressure:  [18 cmH20-25 cmH20] 19 cmH20  ? ?Intake/Output Summary (Last 24 hours) at 01/28/2022 1158 ?Last data filed at 01/28/2022 1100 ?Gross per 24 hour  ?Intake 601.87 ml  ?Output 640 ml  ?Net -38.13 ml  ? ?Filed Weights  ? 01/27/22 1434  ?Weight: 75.7 kg  ? ? ?Examination: ?General: Intubated awake and  following commands despite sedation.  ?Lungs: Clear chest. ?Cardiovascular: Regular rate and rhythm. ?Abdomen: Soft, non-distended, non-tender. ?Extremities: Edema and erythema of R hand at IV site. No lower extremity edema. ?Neuro: Exam limited by patient sedation.  No focal neurological deficits. ? ?Ancillary tests personally reviewed:  ?Awaiting MRI MRA ?Chest x-ray lung fields clear. ?CT head normal. ?Normal ABG.  Electrolytes normal. ?Low HDL on lipid panel. ? ?Assessment & Plan:  ?Tobacco abuse ?Current smoker.  ? ?- Start transdermal NRT and bupropion to promote tobacco abstinence. ? ?History of hypertension ?Currently normotensive/hypotensive likely owing to sedation. ? ?-Hold home antihypertensives until extubated. ? ?Anaphylactic shock ?History of possible swelling following tPA.  tPA induced anaphylaxis exceedingly rare and patient seems to have improved faster than one would expect. ? ?-Currently symptoms appear resolved. ?-Extubate if cuff leak present ?-Patient refuses steroids due to neuropsychiatric symptoms. ? ?Respiratory failure ([place]) ?Intubated reportedly for airway protection in the context of airway edema which appears to have resolved. ?Patient wide-awake mouthing words. ? ?-Trial of extubation following 1 hour SBT. ? ?Brainstem stroke syndrome ?Presented to [facility] with complaints of double vision and bilateral leg weakness, headache and aphasia versus dysarthria with confusion. ?Received tPA there.  No residual neurological symptoms at this time. ? ?-Proceed to extubation ?-MRI MRA thereafter. ?-Start secondary prevention: ASA, rosuvastatin. ? ? ?Best Practice (right click and "Reselect all SmartList Selections" daily)  ? ?Diet/type: tubefeeds ?DVT prophylaxis: not indicated ?GI prophylaxis: PPI ?Lines: Central line ?Foley:  Yes, and it is still needed ?Code Status:  full code ?Last date of multidisciplinary  goals of care discussion [4/28] ? ?CRITICAL CARE ?Performed by: Kipp Brood ? ? ?Total critical care time: 40 minutes ? ?Critical care time was exclusive of separately billable procedures and treating other patients. ? ?Critical care was necessary to treat or prevent imminent or life-threatening deterioration. ? ?Critical care was time spent personally by me on the following activities: development of treatment plan with patient and/or surrogate as well as nursing, discussions with consultants, evaluation of patient's response to treatment, examination of patient, obtaining history from patient or surrogate, ordering and performing treatments and interventions, ordering and review of laboratory studies, ordering and review of radiographic studies, pulse oximetry, re-evaluation of patient's condition and participation in multidisciplinary rounds. ? ?Kipp Brood, MD FRCPC ?ICU Physician ?[place]  ?Pager: [phone] ?Mobile: [phone] ?After hours: [phone]. ? ?

## 2022-01-28 NOTE — Assessment & Plan Note (Signed)
Currently normotensive/hypotensive likely owing to sedation. ? ?-Hold home antihypertensives until extubated. ?

## 2022-01-28 NOTE — Progress Notes (Signed)
Pt placed on PS/CPAP 10/5 on 40% and is tolerating well. RT will continue to monitor. °

## 2022-01-28 NOTE — Progress Notes (Addendum)
Initial Nutrition Assessment ? ?DOCUMENTATION CODES:  ? ?Not applicable ? ?INTERVENTION:  ? ?When vomiting resolves, recommend resume tube feeding via OG tube: ?RD ordered TF to resume this afternoon: Vital AF 1.2 at 25 ml/h, increase by 10 ml every 4 hours to goal rate of 65 ml/h (1560 ml per day) ? ?Provides 1872 kcal, 117 gm protein, 1265 ml free water daily ? ?NUTRITION DIAGNOSIS:  ? ?Inadequate oral intake related to inability to eat as evidenced by NPO status. ? ?GOAL:  ? ?Patient will meet greater than or equal to 90% of their needs ? ?MONITOR:  ? ?Vent status, Labs, TF tolerance ? ?REASON FOR ASSESSMENT:  ? ?Ventilator, Consult ?Enteral/tube feeding initiation and management, Assessment of nutrition requirement/status ? ?ASSESSMENT:  ? ?51 yo female admitted from [facility] for management of anaphylaxis to tPA and continued stroke management. She was already intubated prior to transfer to [facility]. PMH includes HTN, HLD, smoker, cannabis abuse, MDD. ? ?RD working remotely. ?Received consult from the adult tube feeding protocol. ?Currently ordered Vital High Protein at 40 ml/h with Prosource TF 45 ml BID to provide 1040 kcal, 106 gm protein, 803 ml free water daily.  ?TF was held last night ~21:00 d/t vomiting. Emesis x 3 documented in the past 24 hours. Last emesis documented 06:43 this morning.  ?OGT tip in the mid stomach per X-ray report. ?Patient with ongoing bradycardia this morning.  ? ?Patient is currently intubated on ventilator support ?MV: 8.1 L/min ?Temp (24hrs), Avg:99.5 ?F (37.5 ?C), Min:98.7 ?F (37.1 ?C), Max:100.3 ?F (37.9 ?C) ? ? ?Labs reviewed.  ?CBG: 116-112 ? ?Medications reviewed and include Colace, Novolog, Miralax, Precedex, fentanyl, Levophed. ? ?No weight history available for review.  ?No height available. ? ?NUTRITION - FOCUSED PHYSICAL EXAM: ? ?Unable to complete ? ?Diet Order:   ?Diet Order   ? ?       ?  Diet NPO time specified  Diet effective now       ?  ? ?  ?  ? ?   ? ? ?EDUCATION NEEDS:  ? ?No education needs have been identified at this time ? ?Skin:  Skin Assessment: Reviewed RN Assessment ? ?Last BM:  no BM documented ? ?Height:  ? ?Ht Readings from Last 1 Encounters:  ?No data found for Ht  ? ? ?Weight:  ? ?Wt Readings from Last 1 Encounters:  ?01/27/22 75.7 kg  ? ? ? ?BMI:  There is no height or weight on file to calculate BMI. ? ?Estimated Nutritional Needs:  ? ?Kcal:  1800-2000 ? ?Protein:  100-120 gm ? ?Fluid:  1.8-2 L ? ? ? ?Gabriel Rainwater RD, LDN, CNSC ?Please refer to Amion for contact information.                                                       ? ?

## 2022-01-28 NOTE — Hospital Course (Signed)
4/29 received in transfer from [facility] ?

## 2022-01-28 NOTE — Assessment & Plan Note (Signed)
History of possible swelling following tPA.  tPA induced anaphylaxis exceedingly rare and patient seems to have improved faster than one would expect. ? ?-Currently symptoms appear resolved. ?-Extubate if cuff leak present ?-Patient refuses steroids due to neuropsychiatric symptoms. ?

## 2022-01-28 NOTE — Assessment & Plan Note (Addendum)
Extubated yesterday.  ?Still hypoxic to 80's with exertions. ?Smoker with cough. CXR normal.  ?Low probability of PE (Well's score of 0)  ? ?- Home oxygen qualification ?- Discharge once in place and able to ambulate ?- Neurological symptoms may have been due to hypoxia.  ?

## 2022-01-28 NOTE — Assessment & Plan Note (Addendum)
Presented to [facility] with complaints of double vision and bilateral leg weakness, headache and aphasia versus dysarthria with confusion. ?Received tPA there.  No residual neurological symptoms at this time. ?MRI negative ? ?-No evidence of stroke. Possibly hypoxia related.  ?- Primary stroke prevention.  ? ?

## 2022-01-28 NOTE — Progress Notes (Signed)
E-Link notified of patient's heart rate dipping into the 30s. Precedex off. ?

## 2022-01-28 NOTE — Progress Notes (Signed)
E-Link RN updated about patient's bradycardia. Precedex has been stopped for now. Will continue to monitor.  ?

## 2022-01-28 NOTE — Progress Notes (Addendum)
STROKE TEAM PROGRESS NOTE  ? ?INTERVAL HISTORY ?Patient is seen in her room with no family at the bedside.  Yesterday, she presented to an outside hospital ED with acute onset dizziness, diplopia, confusion, headache and expressive aphasia.  She was given tPA to treat a possible stroke and the experienced oral and facial swelling with respiratory compromise.  She then was intubated and placed on norepi for hypotension.  Patient and family refused steroid treatment for allergic reaction, so she was treated with diphenhydramine. ? ?Vitals:  ? 01/28/22 1230 01/28/22 1245 01/28/22 1300 01/28/22 1315  ?BP: 127/68 (!) 129/102 132/77 103/60  ?Pulse: 62 63 83 78  ?Resp: 15 16 (!) 9 16  ?Temp:      ?TempSrc:      ?SpO2: 97% 98% 94% 91%  ?Weight:      ? ?CBC:  ?Recent Labs  ?Lab 01/28/22 ?0457 01/28/22 ?0504  ?WBC 17.5*  --   ?HGB 13.1 13.3  ?HCT 40.6 39.0  ?MCV 88.3  --   ?PLT 235  --   ? ?Basic Metabolic Panel:  ?Recent Labs  ?Lab 01/27/22 ?1612 01/28/22 ?0457 01/28/22 ?0504  ?NA  --  138 138  ?K  --  3.5 3.6  ?CL  --  108  --   ?CO2  --  23  --   ?GLUCOSE  --  114*  --   ?BUN  --  8  --   ?CREATININE  --  0.81  --   ?CALCIUM  --  8.0*  --   ?MG 2.1 1.8  --   ?PHOS 3.6 3.4  --   ? ?Lipid Panel:  ?Recent Labs  ?Lab 01/27/22 ?1908  ?CHOL 129  ?TRIG 113  ?HDL 31*  ?CHOLHDL 4.2  ?VLDL 23  ?LDLCALC 75  ? ?HgbA1c:  ?Recent Labs  ?Lab 01/27/22 ?1612  ?HGBA1C 5.5  ? ?Urine Drug Screen: No results for input(s): LABOPIA, COCAINSCRNUR, LABBENZ, AMPHETMU, THCU, LABBARB in the last 168 hours.  ?Alcohol Level No results for input(s): ETH in the last 168 hours. ? ?IMAGING past 24 hours ?CT HEAD WO CONTRAST (5MM) ? ?Result Date: 01/27/2022 ?CLINICAL DATA:  Dizziness and blurry vision, stroke suspected EXAM: CT HEAD WITHOUT CONTRAST TECHNIQUE: Contiguous axial images were obtained from the base of the skull through the vertex without intravenous contrast. RADIATION DOSE REDUCTION: This exam was performed according to the departmental  dose-optimization program which includes automated exposure control, adjustment of the mA and/or kV according to patient size and/or use of iterative reconstruction technique. COMPARISON:  None. FINDINGS: Brain: No evidence of acute infarction, hemorrhage, cerebral edema, mass, mass effect, or midline shift. No hydrocephalus or extra-axial fluid collection. Vascular: No hyperdense vessel. Skull: Normal. Negative for fracture or focal lesion. Sinuses/Orbits: Minimal mucosal thickening in the right maxillary sinus and posterior ethmoid air cells. The orbits are unremarkable. Other: The mastoid air cells are well aerated. IMPRESSION: IMPRESSION No acute intracranial process. Electronically Signed   By: Wiliam KeAlison  Vasan M.D.   On: 01/27/2022 20:44  ? ?DG Chest Port 1 View ? ?Result Date: 01/28/2022 ?CLINICAL DATA:  Respirator dependent. Evaluate airspace disease, pleural effusion, pneumonia or aspiration. EXAM: PORTABLE CHEST 1 VIEW COMPARISON:  Radiograph yesterday. FINDINGS: Endotracheal tube tip at the level of the clavicular heads, 6.7 cm from the carina, borderline high in positioning. Tip and side port of the enteric tube in the stomach. Right internal jugular central line remains in place. Persistent left lower lobe airspace disease. There may be slight improvement  from yesterday. Possible small left pleural effusion again seen. No progressive consolidation. No pneumothorax. IMPRESSION: 1. Endotracheal tube tip at the level of the clavicular heads, 6.7 cm from the carina, borderline high in positioning. 2. Persistent left lower lobe airspace disease and possible small pleural effusion. Slight improvement from yesterday. Electronically Signed   By: Narda Rutherford M.D.   On: 01/28/2022 12:28  ? ?DG Chest Port 1 View ? ?Result Date: 01/27/2022 ?CLINICAL DATA:  Respiratory failure.  Nasogastric tube present. EXAM: PORTABLE CHEST 1 VIEW COMPARISON:  None available. Report only from prior chest radiographs 07/11/2007.  FINDINGS: 1512 hours. The heart size and mediastinal contours are normal. Endotracheal tube terminates at the level of the mid trachea. Enteric tube projects below the diaphragm, with the side hole projecting over the proximal stomach. There is left lower lobe airspace disease with associated volume loss and a possible left pleural effusion. The right lung is clear. No pneumothorax. The bones appear unremarkable. IMPRESSION: Satisfactory position of the endotracheal and enteric tubes. Left lower lobe airspace disease and possible left pleural effusion suspicious for pneumonia or aspiration. Correlate clinically. Followup PA and lateral chest X-ray is recommended in 3-4 weeks following trial of antibiotic therapy to ensure resolution and exclude underlying malignancy. Electronically Signed   By: Carey Bullocks M.D.   On: 01/27/2022 15:29  ? ?DG Abd Portable 1V ? ?Result Date: 01/27/2022 ?CLINICAL DATA:  Respiratory failure, nasogastric tube present. EXAM: PORTABLE ABDOMEN - 1 VIEW COMPARISON:  Chest radiograph same date. No previous comparison studies. FINDINGS: 1514 hours. Enteric tube projects over the left mid abdomen consistent with position in the mid stomach. The visualized bowel gas pattern is nonobstructive, and there is no evidence of free intraperitoneal air. As seen on chest radiographs, there is retrocardiac left lower lobe airspace disease with a possible left pleural effusion. IMPRESSION: Enteric tube projects to the level of the mid stomach. Left lower lobe airspace disease and possible small left pleural effusion. Electronically Signed   By: Carey Bullocks M.D.   On: 01/27/2022 15:30   ? ?PHYSICAL EXAM ?General:  Alert, intubated, well-developed, obese patient in no acute distress ?Respiratory:  Respirations synchronous with ventilator ?Neurological: PERRL, follows commands with all four extremities, able to communicate with gestures and writing. Visual fields full but continues to endorse  diplopia ? ?ASSESSMENT/PLAN ?Ms. Mahoganie Basher is a 51 y.o. female with history of HTN, HLD, tobacco abuse, cannabis abuse and MDD presenting with acute onset dizziness, diplopia, confusion, headache and expressive aphasia.  She initially presented to an outside hospital. She was given tPA to treat a possible stroke and the experienced oral and facial swelling with respiratory compromise.  She then was intubated and placed on norepi for hypotension.  Patient and family refused steroid treatment for allergic reaction, so she was treated with diphenhydramine.  She was transferred here for further management. ? ?Possible stroke s/p tPA  ?CT head x 2 No acute abnormality.  ?MRA pending ?MRI  pending ?Carotid Doppler  pending ?2D Echo pending ?LDL 75 ?HgbA1c 5.5 ?VTE prophylaxis -Lovenox ?No antithrombotic prior to admission, now on ASA 325mg  ?Therapy recommendations:  pending ?Disposition:  pending ? ?History of Hypertension, now hypotensive s/p tPA ?Home meds:  amlodipine 10 mg daily, lisinopril 40 mg daily ?Unstable, requiring norepi->now off ?MAP goal >65 ?Long-term BP goal normotensive ? ?Hyperlipidemia ?Home meds:  none ?LDL 75, goal < 70 ?Add rosuvastatin 20 mg daily  ?Continue statin at discharge ? ?Respiratory failure ?Patient was intubated for airway  protection ?4/29 extubated  ?Will continue to monitor for symptoms of airway compromise ? ?Tobacco abuse ?Current smoker ?Smoking cessation counseling provided ?Pt is willing to quit ? ?Other Stroke Risk Factors ?Obesity ? ?Other Active Problems ?tPA allergy with oral pharyngeal swelling - intubated now extubated - family against steroids stating allergy to steroids. ? ?Hospital day # 1 ? ?Cortney E Ernestina Columbia , MSN, AGACNP-BC ?[facility] ?See Amion for schedule and pager information ?01/28/2022 1:54 PM ?  ?ATTENDING NOTE: ?I reviewed above note and agree with the assessment and plan. Pt was seen and examined.  ? ?No family at the bedside, pt still  intubated but fully awake, alert, eyes open, not able to talk due to ET but able to write, stated that she has diplopia, following all simple commands. No gaze palsy, tracking bilaterally, visual field full, PERRL. Faci

## 2022-01-28 NOTE — Assessment & Plan Note (Addendum)
Current smoker. Patient is committed to quitting.  ? ?- Start transdermal NRT and bupropion to promote tobacco abstinence. ?

## 2022-01-29 ENCOUNTER — Inpatient Hospital Stay (HOSPITAL_COMMUNITY): Payer: Medicaid Other

## 2022-01-29 ENCOUNTER — Other Ambulatory Visit: Payer: Self-pay

## 2022-01-29 ENCOUNTER — Encounter (HOSPITAL_COMMUNITY): Payer: Self-pay | Admitting: Neurology

## 2022-01-29 DIAGNOSIS — I6389 Other cerebral infarction: Secondary | ICD-10-CM

## 2022-01-29 DIAGNOSIS — R42 Dizziness and giddiness: Secondary | ICD-10-CM | POA: Diagnosis not present

## 2022-01-29 DIAGNOSIS — J449 Chronic obstructive pulmonary disease, unspecified: Secondary | ICD-10-CM

## 2022-01-29 LAB — ECHOCARDIOGRAM COMPLETE
AR max vel: 2.16 cm2
AV Area VTI: 2.13 cm2
AV Area mean vel: 2.14 cm2
AV Mean grad: 6 mmHg
AV Peak grad: 11.7 mmHg
Ao pk vel: 1.71 m/s
Area-P 1/2: 2.3 cm2
S' Lateral: 2.6 cm
Weight: 2670.21 oz

## 2022-01-29 LAB — BASIC METABOLIC PANEL
Anion gap: 6 (ref 5–15)
BUN: 10 mg/dL (ref 6–20)
CO2: 26 mmol/L (ref 22–32)
Calcium: 8.2 mg/dL — ABNORMAL LOW (ref 8.9–10.3)
Chloride: 104 mmol/L (ref 98–111)
Creatinine, Ser: 0.69 mg/dL (ref 0.44–1.00)
GFR, Estimated: >60 mL/min (ref >60)
Glucose, Bld: 87 mg/dL (ref 70–99)
Potassium: 3.5 mmol/L (ref 3.5–5.1)
Sodium: 136 mmol/L (ref 135–145)

## 2022-01-29 LAB — GLUCOSE, CAPILLARY
Glucose-Capillary: 118 mg/dL — ABNORMAL HIGH (ref 70–99)
Glucose-Capillary: 120 mg/dL — ABNORMAL HIGH (ref 70–99)
Glucose-Capillary: 70 mg/dL (ref 70–99)
Glucose-Capillary: 82 mg/dL (ref 70–99)
Glucose-Capillary: 86 mg/dL (ref 70–99)
Glucose-Capillary: 89 mg/dL (ref 70–99)
Glucose-Capillary: 93 mg/dL (ref 70–99)

## 2022-01-29 LAB — CBC
HCT: 36.4 % (ref 36.0–46.0)
Hemoglobin: 11.8 g/dL — ABNORMAL LOW (ref 12.0–15.0)
MCH: 28.9 pg (ref 26.0–34.0)
MCHC: 32.4 g/dL (ref 30.0–36.0)
MCV: 89 fL (ref 80.0–100.0)
Platelets: 145 10*3/uL — ABNORMAL LOW (ref 150–400)
RBC: 4.09 MIL/uL (ref 3.87–5.11)
RDW: 12.5 % (ref 11.5–15.5)
WBC: 12.2 10*3/uL — ABNORMAL HIGH (ref 4.0–10.5)
nRBC: 0 % (ref 0.0–0.2)

## 2022-01-29 IMAGING — MR MR MRA HEAD W/O CM
1 series · 19 of 48 positions shown · non-contrast
Comparison: No pertinent prior exam.

CLINICAL DATA: Stroke, follow up

EXAM:
MRI HEAD WITHOUT CONTRAST
MRA HEAD WITHOUT CONTRAST
TECHNIQUE: Multiplanar, multi-echo pulse sequences of the brain and surrounding
structures were acquired without intravenous contrast. Angiographic
images of the Circle of Willis were acquired using MRA technique
without intravenous contrast.

[Series 5: 3d cow · axial · 0.5mm · 0.41mm/px · z∈[-129,-48]mm · 19 of 172 slices shown]
[im 1/172]
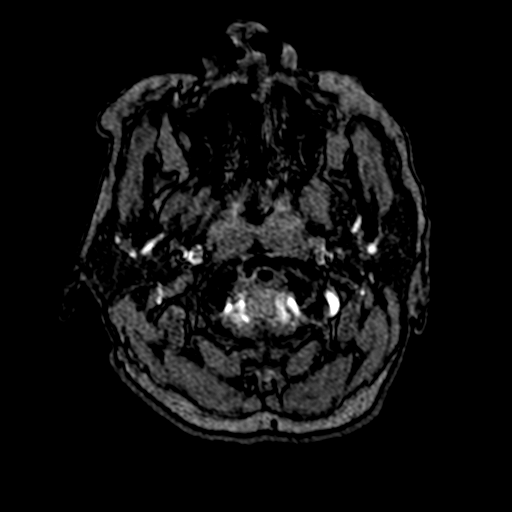
[im 4/172]
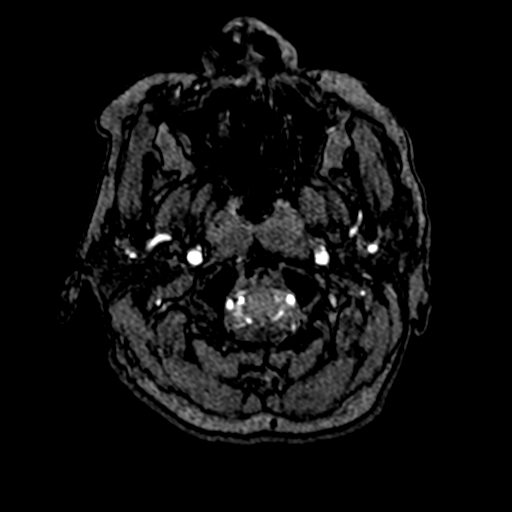
[im 8/172]
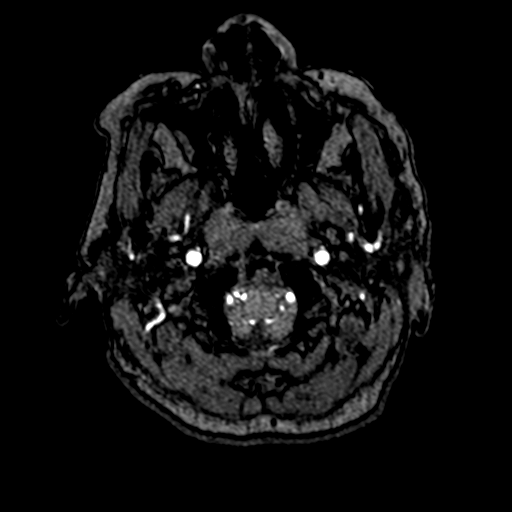
[im 11/172]
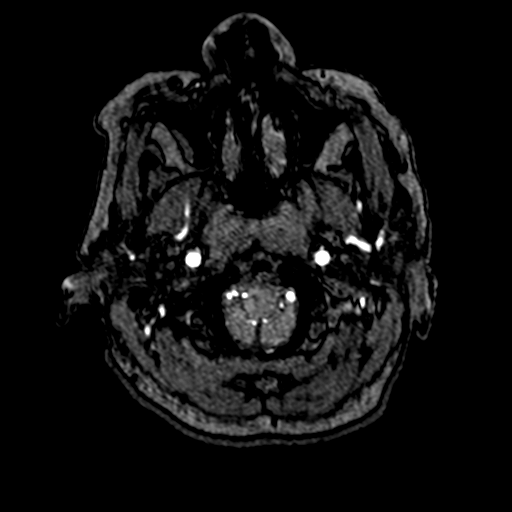
[im 15/172]
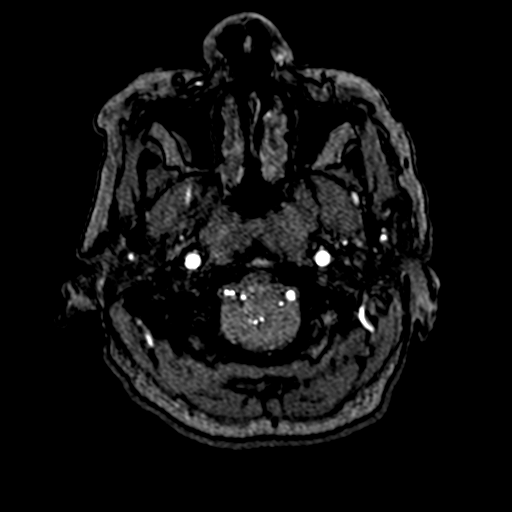
[im 19/172]
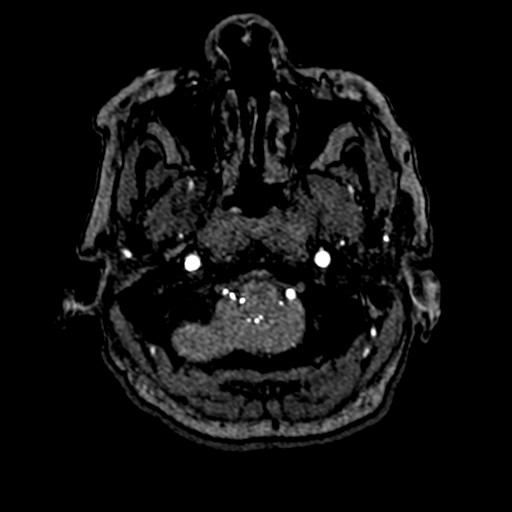
[im 22/172]
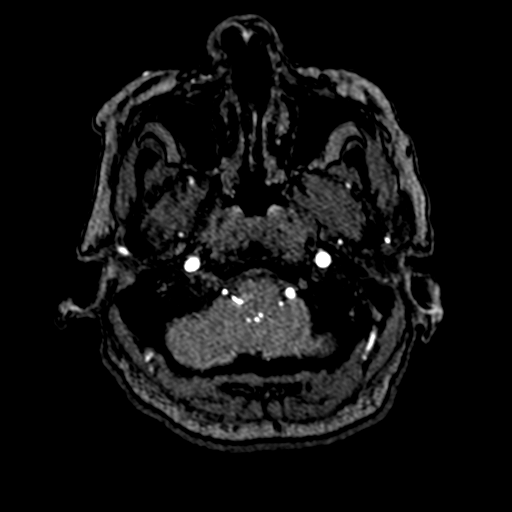
[im 26/172]
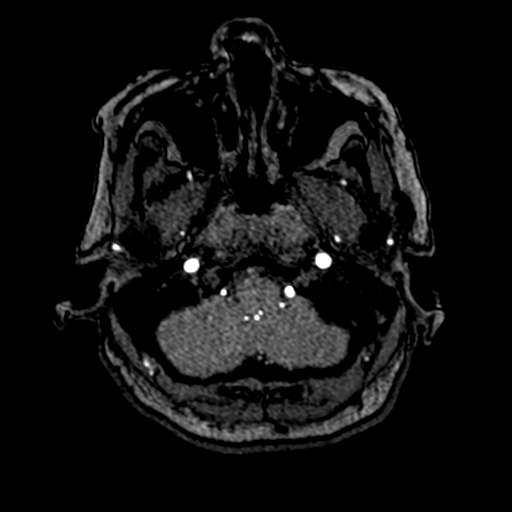
[im 30/172]
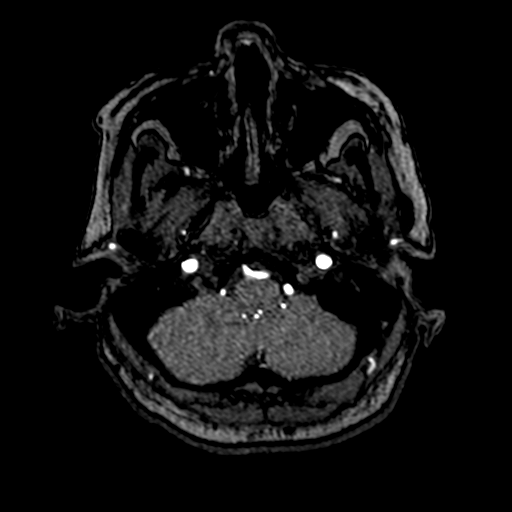
[im 33/172]
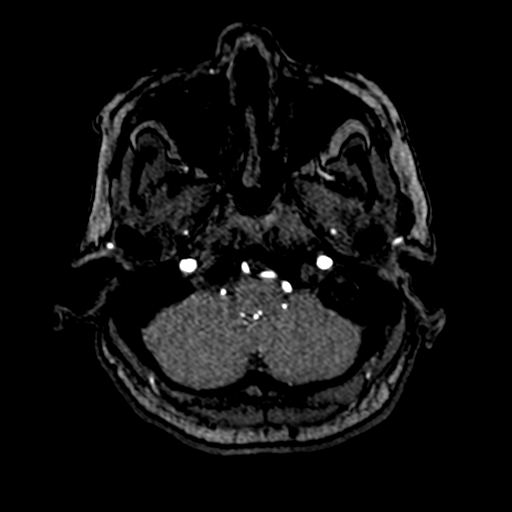
[im 37/172]
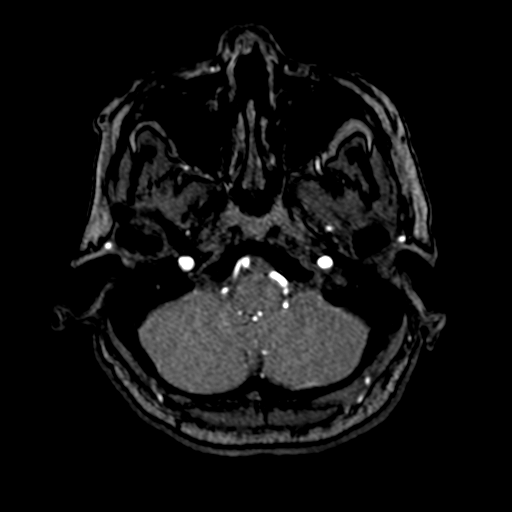
[im 55/172]
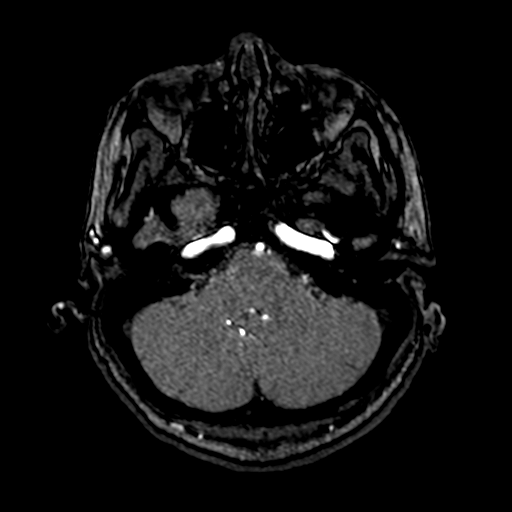
[im 77/172]
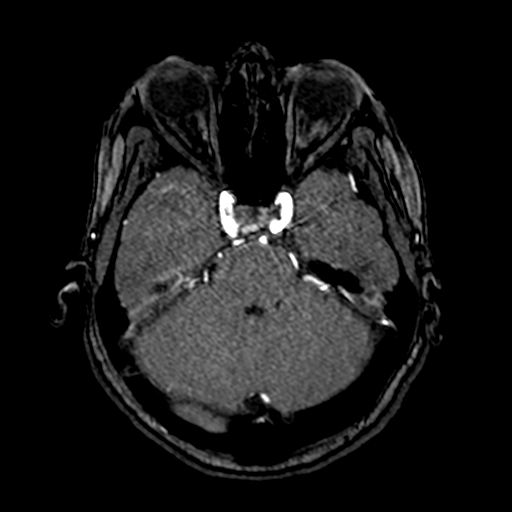
[im 88/172]
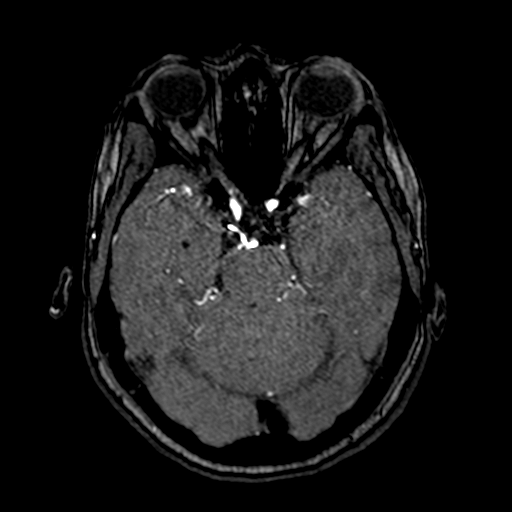
[im 99/172]
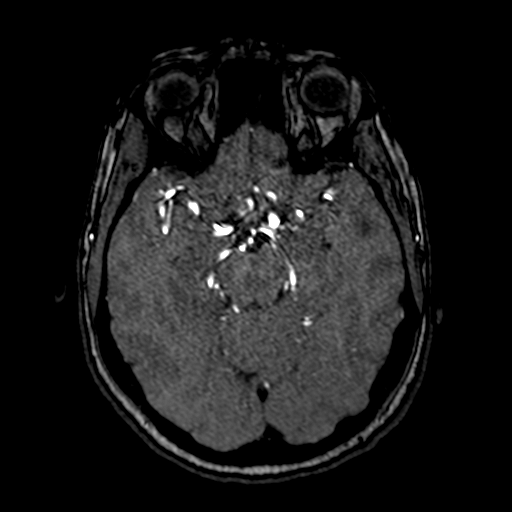
[im 121/172]
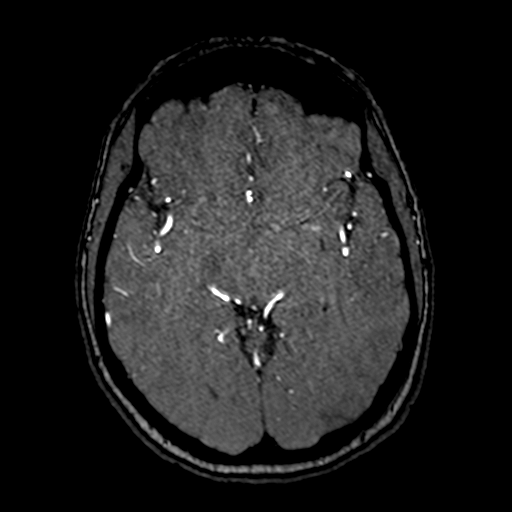
[im 142/172]
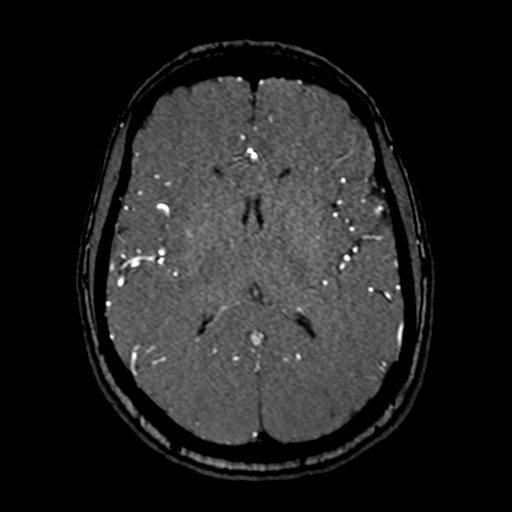
[im 146/172]
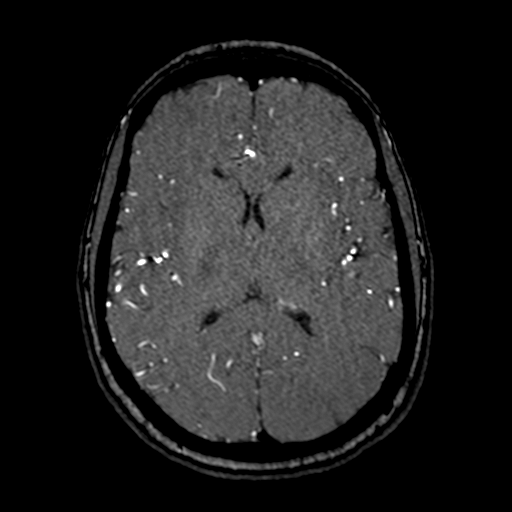
[im 164/172]
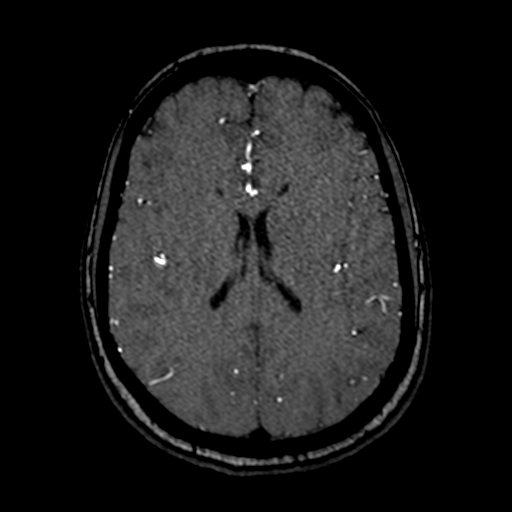

[19 of 48 positions shown; findings below may reference images not displayed]

FINDINGS: MRI HEAD

Brain: There is no acute infarction or intracranial hemorrhage.
There is no intracranial mass, mass effect, or edema. There is no
hydrocephalus or extra-axial fluid collection. Ventricles and sulci
are normal in size and configuration. Patchy foci of T2
hyperintensity in the supratentorial white matter are nonspecific
but may reflect mild chronic microvascular ischemic changes.

Vascular: Major vessel flow voids at the skull base are preserved.

Skull and upper cervical spine: Normal marrow signal is preserved.

Sinuses/Orbits: Minor mucosal thickening.  Orbits are unremarkable.

Other: Sella is unremarkable. Mild patchy mastoid fluid
opacification.

MRA HEAD

Intracranial internal carotid arteries are patent. Middle and
anterior cerebral arteries are patent. Intracranial vertebral
arteries, basilar artery, posterior cerebral arteries are patent.
Bilateral posterior communicating arteries are present. There is no
significant stenosis or aneurysm.
IMPRESSION: No acute infarction, hemorrhage, or mass.

Probable mild chronic microvascular ischemic changes.

No proximal intracranial vessel occlusion or significant stenosis.

## 2022-01-29 IMAGING — MR MR HEAD W/O CM
12 of 13 series · 44 of 48 positions shown · non-contrast
Comparison: No pertinent prior exam.

CLINICAL DATA: Stroke, follow up

EXAM:
MRI HEAD WITHOUT CONTRAST
MRA HEAD WITHOUT CONTRAST
TECHNIQUE: Multiplanar, multi-echo pulse sequences of the brain and surrounding
structures were acquired without intravenous contrast. Angiographic
images of the Circle of Willis were acquired using MRA technique
without intravenous contrast.

[Series 5: DWI · axial · 3.0mm · 0.88mm/px · z∈[-135,+12]mm · 8 of 100 slices shown (1 of 4)]
[im 1/100]
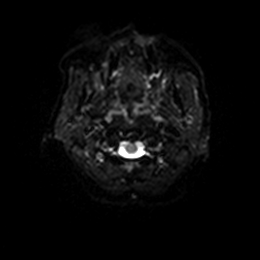
[im 15/100]
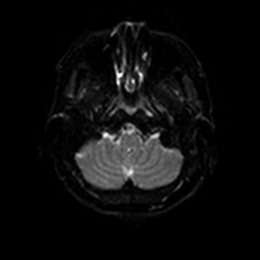
[im 29/100]
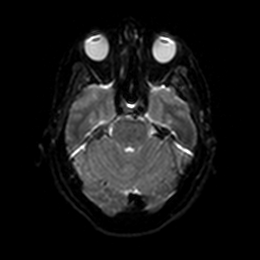
[im 43/100]
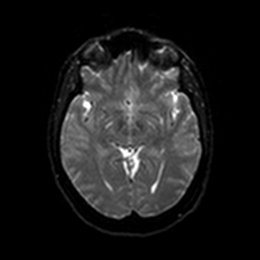
[im 57/100]
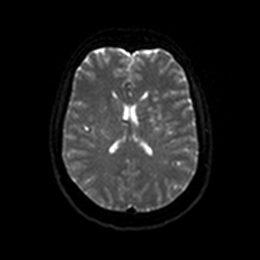
[im 71/100]
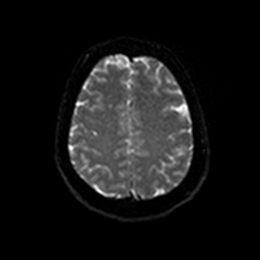
[im 85/100]
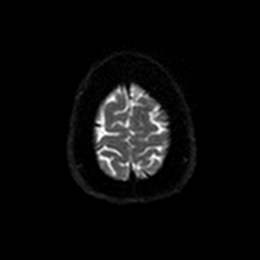
[im 100/100]
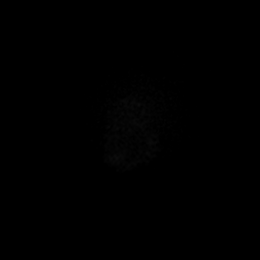

[Series 6: DWI · axial · 3.0mm · 0.88mm/px · z∈[-135,+12]mm · 4 of 50 slices shown (2 of 4)]
[im 1/50]
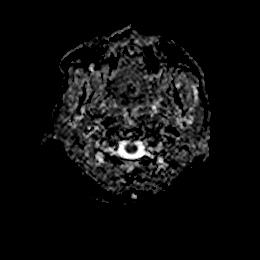
[im 17/50]
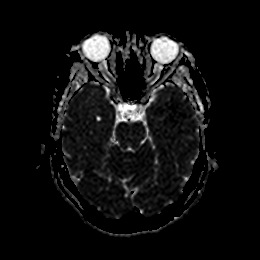
[im 33/50]
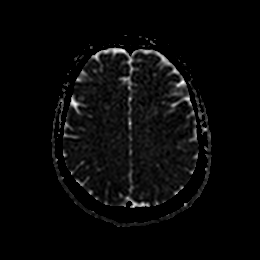
[im 50/50]
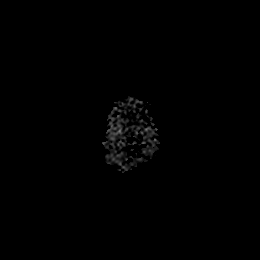

[Series 7: FLAIR · axial · 5.0mm · 0.45mm/px · z∈[-135,+14]mm · 2 of 26 slices shown]
[im 1/26]
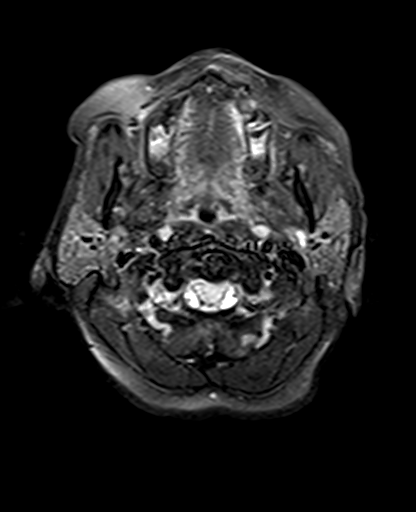
[im 26/26]
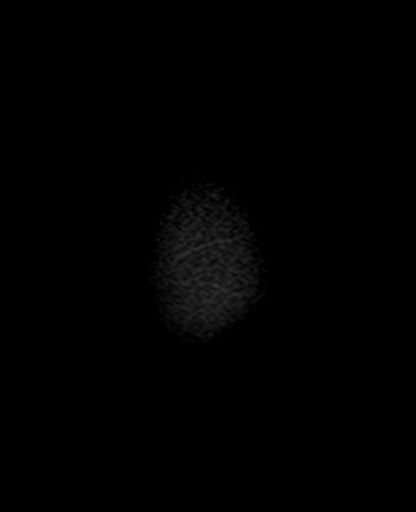

[Series 8: DWI · coronal · 4.0mm · 0.88mm/px · 5 of 68 slices shown (3 of 4)]
[im 1/68]
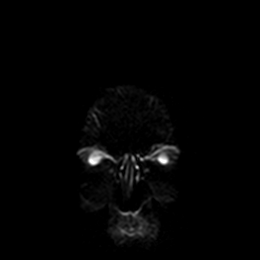
[im 17/68]
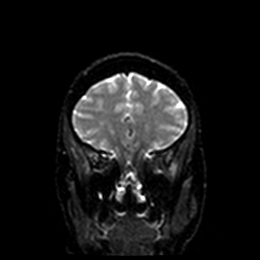
[im 34/68]
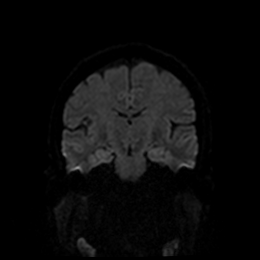
[im 51/68]
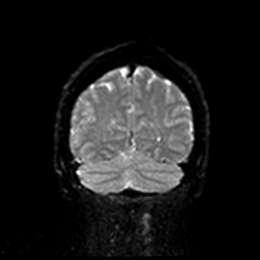
[im 68/68]
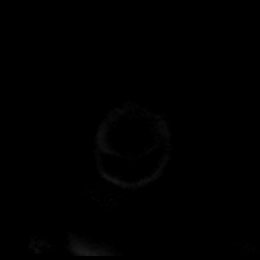

[Series 9: DWI · coronal · 4.0mm · 0.88mm/px · 3 of 34 slices shown (4 of 4)]
[im 1/34]
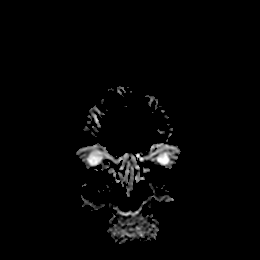
[im 17/34]
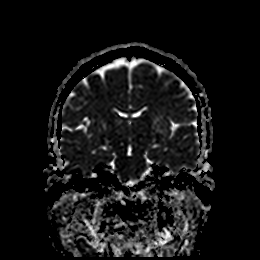
[im 34/34]
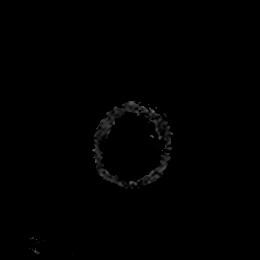

[Series 10: mag_images · axial · 3.0mm · 0.90mm/px · z∈[-136,+16]mm · 4 of 52 slices shown]
[im 1/52]
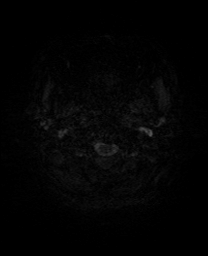
[im 18/52]
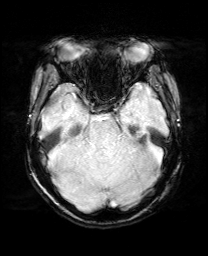
[im 35/52]
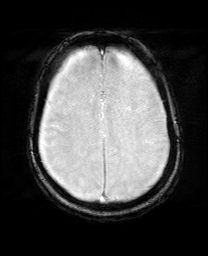
[im 52/52]
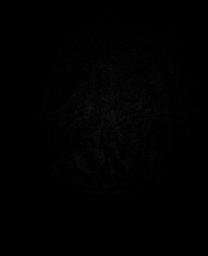

[Series 11: pha_images · axial · 3.0mm · 0.90mm/px · z∈[-136,+16]mm · 4 of 52 slices shown]
[im 1/52]
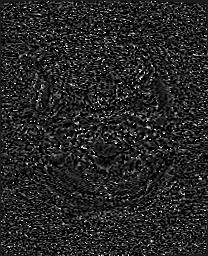
[im 18/52]
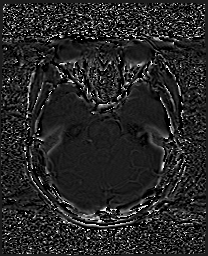
[im 35/52]
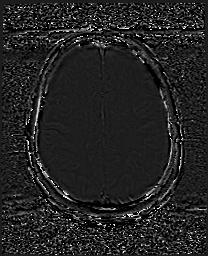
[im 52/52]
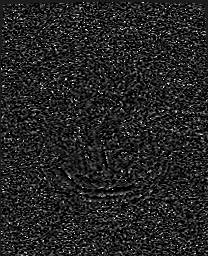

[Series 12: swi_images · axial · 3.0mm · 0.90mm/px · z∈[-136,+16]mm · 4 of 52 slices shown]
[im 1/52]
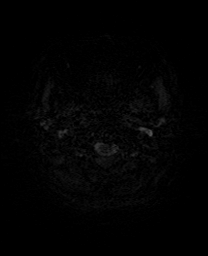
[im 18/52]
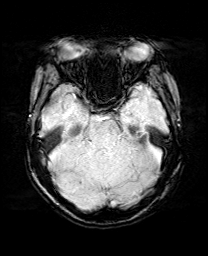
[im 35/52]
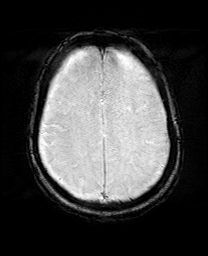
[im 52/52]
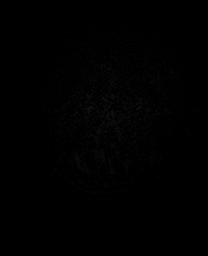

[Series 13: mip_images(sw) · axial · 24.0mm · 0.90mm/px · z∈[-126,+5]mm · 4 of 45 slices shown]
[im 1/45]
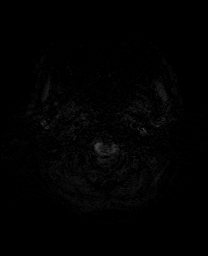
[im 15/45]
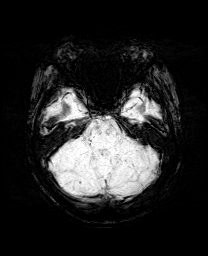
[im 30/45]
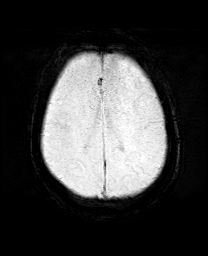
[im 45/45]
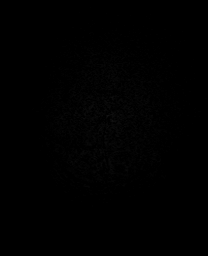

[Series 14: T1 · sagittal · 5.0mm · 0.75mm/px · 2 of 23 slices shown]
[im 1/23]
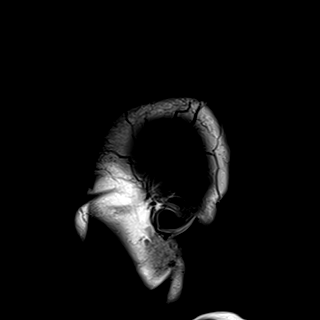
[im 23/23]
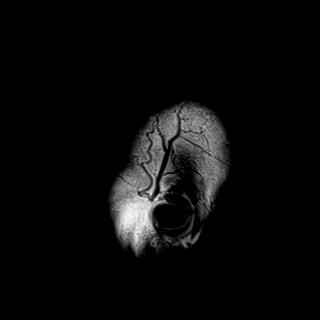

[Series 15: T2 · axial · 5.0mm · 0.72mm/px · z∈[-133,+10]mm · 2 of 25 slices shown (1 of 2)]
[im 1/25]
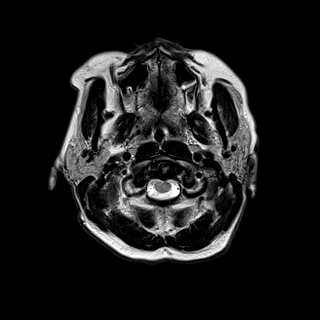
[im 25/25]
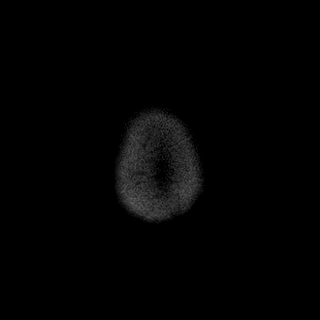

[Series 17: T2 · coronal · 5.0mm · 0.34mm/px · 2 of 29 slices shown (2 of 2)]
[im 1/29]
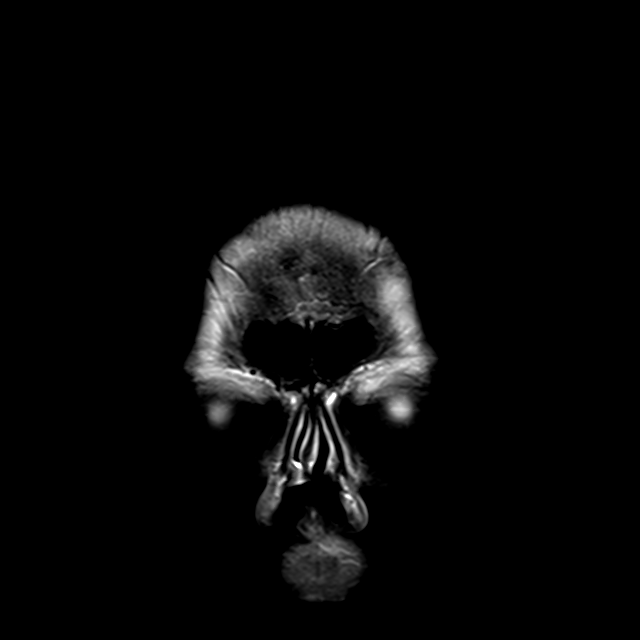
[im 29/29]
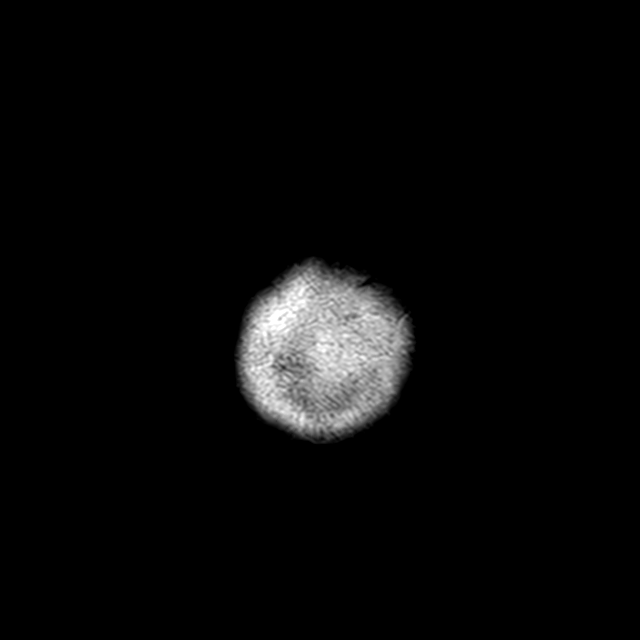

[44 of 48 positions shown; findings below may reference images not displayed]

FINDINGS: MRI HEAD

Brain: There is no acute infarction or intracranial hemorrhage.
There is no intracranial mass, mass effect, or edema. There is no
hydrocephalus or extra-axial fluid collection. Ventricles and sulci
are normal in size and configuration. Patchy foci of T2
hyperintensity in the supratentorial white matter are nonspecific
but may reflect mild chronic microvascular ischemic changes.

Vascular: Major vessel flow voids at the skull base are preserved.

Skull and upper cervical spine: Normal marrow signal is preserved.

Sinuses/Orbits: Minor mucosal thickening.  Orbits are unremarkable.

Other: Sella is unremarkable. Mild patchy mastoid fluid
opacification.

MRA HEAD

Intracranial internal carotid arteries are patent. Middle and
anterior cerebral arteries are patent. Intracranial vertebral
arteries, basilar artery, posterior cerebral arteries are patent.
Bilateral posterior communicating arteries are present. There is no
significant stenosis or aneurysm.
IMPRESSION: No acute infarction, hemorrhage, or mass.

Probable mild chronic microvascular ischemic changes.

No proximal intracranial vessel occlusion or significant stenosis.

## 2022-01-29 MED ORDER — ASPIRIN 325 MG PO TABS
325.0000 mg | ORAL_TABLET | Freq: Every day | ORAL | Status: DC
Start: 2022-01-29 — End: 2022-01-29
  Administered 2022-01-29: 325 mg via ORAL

## 2022-01-29 MED ORDER — INSULIN ASPART 100 UNIT/ML IJ SOLN
0.0000 [IU] | Freq: Three times a day (TID) | INTRAMUSCULAR | Status: DC
  Administered 2022-01-30: 2 [IU] via SUBCUTANEOUS
  Administered 2022-01-30 (×2): 3 [IU] via SUBCUTANEOUS
  Administered 2022-01-31 (×2): 2 [IU] via SUBCUTANEOUS

## 2022-01-29 MED ORDER — ASPIRIN EC 81 MG PO TBEC
81.0000 mg | DELAYED_RELEASE_TABLET | Freq: Every day | ORAL | Status: DC
  Administered 2022-01-30 – 2022-01-31 (×2): 81 mg via ORAL
  Filled 2022-01-29 (×2): qty 1

## 2022-01-29 MED ORDER — MOMETASONE FURO-FORMOTEROL FUM 100-5 MCG/ACT IN AERO: 2.0000 | INHALATION_SPRAY | Freq: Two times a day (BID) | RESPIRATORY_TRACT | Status: DC

## 2022-01-29 MED ORDER — UMECLIDINIUM BROMIDE 62.5 MCG/ACT IN AEPB
1.0000 | INHALATION_SPRAY | Freq: Every day | RESPIRATORY_TRACT | Status: DC
  Administered 2022-01-29 – 2022-01-31 (×3): 1 via RESPIRATORY_TRACT
  Filled 2022-01-29: qty 7

## 2022-01-29 NOTE — Progress Notes (Signed)
?  Echocardiogram ?2D Echocardiogram has been performed. ? ?Gerda Diss ?01/29/2022, 5:03 PM ?

## 2022-01-29 NOTE — Progress Notes (Signed)
Pt transported to 3W31 without any complications, Daughter at bed side, personal items taken to pt's room ?

## 2022-01-29 NOTE — Evaluation (Signed)
Physical Therapy Evaluation ?Patient Details ?Name: Angel Carroll ?MRN: [id number] ?DOB: 1971/08/18 ?Today's Date: 01/29/2022 ? ?History of Present Illness ? The pt is a 51 yo female presenting 4/27 with dizziness and blurry vision. Upon arrival, pt also with R arm numbness, AMS, and difficulty ambulating, tPA was administered. The pt then developed tongue swelling and progression of sx leading to intubation 4/27. Extubated 4/29. MRI 4/30 shows no acute abnormalities. ?  ?Clinical Impression ? Pt in bed upon arrival of PT, agreeable to evaluation at this time. 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 The pt plans to return home with her daughter who lives in a 1-story home with 1 step to enter. The pt now presents with limitations in functional mobility, strength, dynamic stability, and activity tolerance due to above dx, and will continue to benefit from skilled PT to address these deficits. She was able to complete initial sit-stand with good strength in BLE, but is dependent on BUE to assist and for balance. The pt was then able to attempt ambulation, but had significant scissoring and needed up to modA to balance. She then completed hallway ambulation with BUE on RW, and tolerated with minG, but continued to have narrow BOS with some scissoring and lateral drifting while walking. The pt was able to maintain SpO2 88-92% on RA with gait, but SpO2 dropped to low of 84% on RA at rest after mobility, so the pt was placed on 2L O2 to maintain in the 90s. Will continue to benefit from skilled PT acutely and following d/c to facilitate return to prior level of independence and mobility. ?   ?   ?  ?Recommendations for follow up therapy are one component of a multi-disciplinary discharge planning process, led by the attending physician.  Recommendations may be updated based on patient status, additional functional  criteria and insurance authorization. ? ?Follow Up Recommendations Home health PT ? ?  ?Assistance Recommended at Discharge Frequent or constant Supervision/Assistance  ?Patient can return home with the following ? A little help with walking and/or transfers;A little help with bathing/dressing/bathroom;Assistance with cooking/housework;Assist for transportation;Help with stairs or ramp for entrance;Direct supervision/assist for financial management;Direct supervision/assist for medications management ? ?  ?Equipment Recommendations Rolling walker (2 wheels)  ?Recommendations for Other Services ?    ?  ?Functional Status Assessment Patient has had a recent decline in their functional status and demonstrates the ability to make significant improvements in function in a reasonable and predictable amount of time.  ? ?  ?Precautions / Restrictions Precautions ?Precautions: Fall ?Precaution Comments: watch O2, on RA-2L O2 during session ?Restrictions ?Weight Bearing Restrictions: No  ? ?  ? ?Mobility ? Bed Mobility ?  ?  ?  ?  ?  ?  ?  ?General bed mobility comments: pt OOB in recliner ?  ? ?Transfers ?Overall transfer level: Needs assistance ?Equipment used: 1 person hand held assist, Rolling walker (2 wheels) ?Transfers: Sit to/from Stand ?Sit to Stand: Min assist, Min guard ?  ?  ?  ?  ?  ?General transfer comment: mina to rise and steady without DME, minG with use of RW. ?  ? ?Ambulation/Gait ?Ambulation/Gait assistance: Min assist ?Gait Distance (Feet): 75 Feet (+ 45 ft) ?Assistive device: Rolling walker (2 wheels) ?Gait Pattern/deviations: Step-through pattern, Decreased stride length, Decreased dorsiflexion - left, Shuffle, Scissoring, Narrow base of support ?Gait velocity: decreased ?Gait velocity interpretation: <1.31 ft/sec,  indicative of household ambulator ?  ?General Gait Details: narrow BOS with frequent scissoring, pt needing cues for safety and directions. Pt often letting go of RW with one hand and then  having drifting to R or L. x2 mild LOB when attempting to walk wihtout UE support, improved with use of RW. SpO2 to low of 78% with poor SpO2 reading, consistently 88-92% on RA with good pleth. ? ? ?Modified Rankin (Stroke Patients Only) ?Modified Rankin (Stroke Patients Only) ?Pre-Morbid Rankin Score: No symptoms ?Modified Rankin: Moderately severe disability ? ?  ? ?Balance Overall balance assessment: Needs assistance ?Sitting-balance support: No upper extremity supported, Feet supported ?Sitting balance-Leahy Scale: Fair ?Sitting balance - Comments: able to lean outside BOS ?  ?Standing balance support: Bilateral upper extremity supported, During functional activity ?Standing balance-Leahy Scale: Poor ?Standing balance comment: LOB x2 without UE support, drifting and needng minA to steady with RW ?  ?  ?  ?  ?  ?  ?  ?  ?  ?  ?  ?   ? ? ? ?Pertinent Vitals/Pain Pain Assessment ?Pain Assessment: Faces ?Faces Pain Scale: Hurts even more ?Pain Location: headache and neck pain ?Pain Descriptors / Indicators: Headache, Grimacing ?Pain Intervention(s): Limited activity within patient's tolerance, Monitored during session, Repositioned  ? ? ?Home Living Family/patient expects to be discharged to:: Private residence ?Living Arrangements: Children ?Available Help at Discharge: Family;Available 24 hours/day ?Type of Home: House ?Home Access: Stairs to enter ?Entrance Stairs-Rails: None ?Entrance Stairs-Number of Steps: 1 ?  ?Home Layout: One level ?Home Equipment: None ?Additional Comments: pt had been living with youngest daughter, plans to move in with older daughter whose house is described above  ?  ?Prior Function Prior Level of Function : Independent/Modified Independent;History of Falls (last six months) ?  ?  ?  ?  ?  ?  ?Mobility Comments: fell and hurt her ankle ~6 months ago, otherwise states no falls. 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  ? ? ?Hand Dominance  ? Dominant Hand: Right ? ?  ?Extremity/Trunk Assessment  ? Upper Extremity Assessment ?Upper Extremity Assessment: Defer to OT evaluation ?  ? ?Lower Extremity Assessment ?Lower Extremity Assessment: Overall WFL for tasks assessed;LLE deficits/detail (pt denies difference in sensation bilaterally, strength grossly 4+/5) ?LLE Deficits / Details: L foot with internal rotation, pt states this is since injury 6 months ago, able to correct with cues for MMT ?  ? ?Cervical / Trunk Assessment ?Cervical / Trunk Assessment: Kyphotic  ?Communication  ? Communication: Expressive difficulties (from pain due to extubation)  ?Cognition Arousal/Alertness: Awake/alert ?Behavior During Therapy: [facility] for tasks assessed/performed ?Overall Cognitive Status: Impaired/Different from baseline ?Area of Impairment: Memory, Problem solving, Safety/judgement, Awareness ?  ?  ?  ?  ?  ?  ?  ?  ?  ?  ?Memory: Decreased short-term memory ?  ?Safety/Judgement: Decreased awareness of safety, Decreased awareness of deficits ?Awareness: Intellectual ?Problem Solving: Slow processing, Decreased initiation, Difficulty sequencing, Requires verbal cues ?General Comments: pt with slow but generally accurate responses, needing increased cues and prompts to complete basic sequencing of dressing and for safety with gait. ?  ?  ? ?  ?General Comments General comments (skin integrity, edema, etc.): SpO2 to low of 84% with good pleth on RA at rest, maintained 88-92% on RA with gait. RN returned pt to 2L O2 after session and maintaining 95% ? ?  ?  Exercises    ? ?Assessment/Plan  ?  ?PT Assessment Patient needs continued PT services  ?PT Problem List Decreased strength;Decreased activity tolerance;Decreased balance;Decreased mobility;Decreased coordination;Decreased cognition;Decreased safety awareness ? ?   ?  ?PT Treatment Interventions DME instruction;Gait training;Stair training;Therapeutic  activities;Functional mobility training;Therapeutic exercise;Balance training;Neuromuscular re-education;Patient/family education   ? ?PT Goals (Current goals can be found in the Care Plan section)  ?Acute Rehab PT Goals

## 2022-01-29 NOTE — Progress Notes (Signed)
eLink Physician-Brief Progress Note ?Patient Name: Angel Carroll ?DOB: [birth year] ?MRN: [id number] ? ? ?Date of Service ? 01/29/2022  ?HPI/Events of Note ? Patient needs SSI changed to AC tid and HS.  ?eICU Interventions ? Order changed.  ? ? ? ?  ? ?Migdalia Dk ?01/29/2022, 7:34 PM ?

## 2022-01-29 NOTE — H&P (Addendum)
? ?NAME:  Angel Carroll, MRN:  [id number], DOB:  10/18/1970, LOS: 2 ?ADMISSION DATE:  01/27/2022, CONSULTATION DATE:  01/27/22 ?REFERRING MD:  Roda Shutters CHIEF COMPLAINT:  Respiratory failure  ? ?History of Present Illness:  ?History provided by patient's daughter. On 4/27 around 5 pm, patient called her daughter reporting dizziness and blurry vision. Daughter states that patient seemed out of it, couldn't remember what her birthday was. Daughter drove patient to ED. [facility] tele-stroke evaluated the patient and found her to have R arm numbness and difficulty ambulating. Initial CT head negative, tPA administered and patient was taken to ICU for monitoring. ? ?3 hours after administration, patient developed increasing swelling of tongue, difficulty swallowing, worsening numbness and weakness. She was subsequently intubated. She became hypotensive to MAP <65, received fluids and levophed and 1 dose of atropine for bradycardia.  ? ?Pertinent  Medical History  ?HTN ?Hypothyroidism ?Current smoker ? ?Significant Hospital Events: ?Including procedures, antibiotic start and stop dates in addition to other pertinent events   ?4/29 received in transfer from [facility] ?4/29 extubated.  ?4/29 MRI negative.  ? ?Interim History / Subjective:  ?Extubated. No neurological symptoms, but unsteady on her feet and hypoxic with ambulation.  ? ?Objective   ?Blood pressure 116/65, pulse 74, temperature 98.4 ?F (36.9 ?C), temperature source Oral, resp. rate 12, weight 75.7 kg, SpO2 92 %. ?   ?FiO2 (%):  [36 %] 36 %  ? ?Intake/Output Summary (Last 24 hours) at 01/29/2022 1641 ?Last data filed at 01/29/2022 0800 ?Gross per 24 hour  ?Intake 250.29 ml  ?Output 375 ml  ?Net -124.71 ml  ? ? ?Filed Weights  ? 01/27/22 1434  ?Weight: 75.7 kg  ? ? ?Examination: ?General: Intubated awake and following commands despite sedation.  ?Lungs: Clear chest. ?Cardiovascular: Regular rate and rhythm. ?Abdomen: Soft, non-distended, non-tender. ?Extremities:  Edema and erythema of R hand at IV site. No lower extremity edema. ?Neuro: Exam limited by patient sedation.  No focal neurological deficits. ? ?Ancillary tests personally reviewed:  ?Awaiting MRI MRA ?Chest x-ray lung fields clear. ?CT head normal. ?Normal ABG.  Electrolytes normal. ?Low HDL on lipid panel. ? ?Assessment & Plan:  ?Tobacco abuse ?Current smoker. Patient is committed to quitting.  ? ?- Start transdermal NRT and bupropion to promote tobacco abstinence. ? ?History of hypertension ?Currently normotensive/hypotensive likely owing to sedation. ? ?-Hold home antihypertensives until extubated. ? ?Anaphylactic shock ?History of possible swelling following tPA.  tPA induced anaphylaxis exceedingly rare and patient seems to have improved faster than one would expect. ? ?-Currently symptoms appear resolved. ?-Extubate if cuff leak present ?-Patient refuses steroids due to neuropsychiatric symptoms. ? ?Respiratory failure (HCC) ?Extubated yesterday.  ?Still hypoxic to 80's with exertions. ?Smoker with cough. CXR normal.  ?Low probability of PE (Well's score of 0)  ? ?- Home oxygen qualification ?- Discharge once in place and able to ambulate ?- Neurological symptoms may have been due to hypoxia.  ? ?Brainstem stroke syndrome ?Presented to [facility] with complaints of double vision and bilateral leg weakness, headache and aphasia versus dysarthria with confusion. ?Received tPA there.  No residual neurological symptoms at this time. ?MRI negative ? ?-No evidence of stroke. Possibly hypoxia related.  ?- Primary stroke prevention.  ? ? ?COPD (chronic obstructive pulmonary disease) (HCC) ?1 month history of cough, particularly when lying down. Normal CXR. ? ?-Suspect COPD ?- Start bronchodilators ?- PFT 's as outpatient.  ?- Patient refuses steroids ? ?Ready for floor. Pulmonary service will follow.  ? ? ?  Best Practice (right click and "Reselect all SmartList Selections" daily)  ? ?Diet/type: oral  diet ?DVT prophylaxis: not indicated ?GI prophylaxis: PPI ?Lines: remove ?Foley:  remove ?Code Status:  full code ?Last date of multidisciplinary goals of care discussion [4/30] - ready for floor ? ?CRITICAL CARE ?Performed by: Lynnell Catalan ? ? ?Total critical care time: 40 minutes ? ?Critical care time was exclusive of separately billable procedures and treating other patients. ? ?Critical care was necessary to treat or prevent imminent or life-threatening deterioration. ? ?Critical care was time spent personally by me on the following activities: development of treatment plan with patient and/or surrogate as well as nursing, discussions with consultants, evaluation of patient's response to treatment, examination of patient, obtaining history from patient or surrogate, ordering and performing treatments and interventions, ordering and review of laboratory studies, ordering and review of radiographic studies, pulse oximetry, re-evaluation of patient's condition and participation in multidisciplinary rounds. ? ?Lynnell Catalan, MD FRCPC ?ICU Physician ?[facility]  ?Pager: [phone] ?Mobile: [phone] ?After hours: [phone]. ? ?

## 2022-01-29 NOTE — Progress Notes (Addendum)
STROKE TEAM PROGRESS NOTE  ? ?INTERVAL HISTORY ?Patient is seen in her room with no family at the bedside.  She was extubated yesterday and is doing well.  She initially declined MRI but now agrees to have the scan.  She has been hemodynamically stable off pressors and her neurological exam is stable. ? ?Vitals:  ? 01/29/22 0815 01/29/22 0900 01/29/22 1000 01/29/22 1155  ?BP:   116/65   ?Pulse: 67 71 74   ?Resp: 19 (!) 22 12   ?Temp:    98.4 ?F (36.9 ?C)  ?TempSrc:    Oral  ?SpO2: 95% 96% 92%   ?Weight:      ? ?CBC:  ?Recent Labs  ?Lab 01/28/22 ?[id number] 01/28/22 ?[id number] 01/29/22 ?[id number]  ?WBC 17.5*  --  12.2*  ?HGB 13.1 13.3 11.8*  ?HCT 40.6 39.0 36.4  ?MCV 88.3  --  89.0  ?PLT 235  --  145*  ? ? ?Basic Metabolic Panel:  ?Recent Labs  ?Lab 01/27/22 ?1612 01/28/22 ?[id number] 01/28/22 ?[id number] 01/28/22 ?0504 01/29/22 ?[id number]  ?NA  --  138   < > 138 136  ?K  --  3.5   < > 3.6 3.5  ?CL  --  108  --   --  104  ?CO2  --  23  --   --  26  ?GLUCOSE  --  114*  --   --  87  ?BUN  --  8  --   --  10  ?CREATININE  --  0.81  --   --  0.69  ?CALCIUM  --  8.0*  --   --  8.2*  ?MG 2.1 1.8  --   --   --   ?PHOS 3.6 3.4  --   --   --   ? < > = values in this interval not displayed.  ? ? ?Lipid Panel:  ?Recent Labs  ?Lab 01/27/22 ?1908  ?CHOL 129  ?TRIG 113  ?HDL 31*  ?CHOLHDL 4.2  ?VLDL 23  ?Floyd 75  ? ? ?HgbA1c:  ?Recent Labs  ?Lab 01/27/22 ?1612  ?HGBA1C 5.5  ? ? ?Urine Drug Screen: No results for input(s): LABOPIA, COCAINSCRNUR, LABBENZ, AMPHETMU, THCU, LABBARB in the last 168 hours.  ?Alcohol Level No results for input(s): ETH in the last 168 hours. ? ?IMAGING past 24 hours ?No results found. ? ?PHYSICAL EXAM ?General:  Alert, well-developed, obese patient in no acute distress ?Respiratory:  Respirations regular and unlabored on supplemental O2 ? ?NEURO:  ?Mental Status: AA&Ox3  ?Speech/Language: speech is without dysarthria or aphasia.  Naming, repetition, fluency, and comprehension intact. ? ?Cranial Nerves:  ?II: PERRL. Visual fields full.   ?III, IV, VI: EOMI. Eyelids elevate symmetrically.  ?V: Sensation is intact to light touch and symmetrical to face.  ?VII: Smile is symmetrical.   ?VIII: hearing intact to voice. ?IX, X: Phonation is normal.  ?[id number] shrug 5/5. ?XII: tongue is midline without fasciculations. ?Motor: 5/5 strength to all muscle groups tested.  ?Tone: is normal and bulk is normal ?Sensation- Intact to light touch bilaterally.  ?Coordination: FTN intact bilaterally.No drift.  ?Gait- deferred ? ?ASSESSMENT/PLAN ?[name] is a 51 y.o. female with history of HTN, HLD, tobacco abuse, cannabis abuse and MDD presenting with acute onset dizziness, diplopia, confusion, headache and expressive aphasia.  She initially presented to an outside hospital. She was given tPA to treat a possible stroke and the experienced oral and facial swelling with respiratory compromise.  She then was intubated and  placed on norepi for hypotension.  Patient and family refused steroid treatment for allergic reaction, so she was treated with diphenhydramine.  She was transferred here for further management.  She has been extubated and is doing well.  Will attempt to get MRI with at least DWI, FLAIR T2 and SWI. ? ?Stroke-like symptoms s/p tPA, possible stroke mimic due to hypoxia and encephalopathy secondary to COPD in smoker ?CT head x 2 No acute abnormality.  ?MRA unremarkable ?MRI no acute infarct ?Carotid Doppler negative ?2D Echo EF 60-65% ?LDL 75 ?HgbA1c 5.5 ?VTE prophylaxis -Lovenox ?No antithrombotic prior to admission, now on ASA 81mg  for primary stroke prevention ?Therapy recommendations:  pending ?Disposition:  pending ? ?Anaphylactic shock s/p tPA ?History of Hypertension, now hypotensive s/p tPA ?Home meds:  amlodipine 10 mg daily, lisinopril 40 mg daily ?Was requiring norepi->now off ?BP improved ?MAP goal >65 ?Long-term BP goal normotensive ? ?Hyperlipidemia ?Home meds:  none ?LDL 75, goal < 70 ?Add rosuvastatin 20 mg daily  ?Continue  statin at discharge ? ?Respiratory failure ?Hypoxia with exertion ?COPD in smoker ?Patient was intubated for airway protection ?4/29 extubated  ?Desat while walking with PT ?CCM on board ?Likely need home O2 on discharge ?Pt and family refuse steroids due to behavioral side effect in the past ? ?Tobacco abuse ?Current smoker ?Smoking cessation counseling provided ?Pt is willing to quit ? ?Other Stroke Risk Factors ?Obesity ? ?Other Active Problems ?tPA anaphylaxis with oral pharyngeal swelling - family against steroids due to previous behavioral side effect with steroids ? ?Hospital day # 2 ? ?[place] , MSN, AGACNP-BC ?[facility] ?See Amion for schedule and pager information ?01/29/2022 12:25 PM ? ?ATTENDING NOTE: ?I reviewed above note and agree with the assessment and plan. Pt was seen and examined.  ? ?No family at bedside.  Patient awake alert, sitting in bed, denies any diplopia, arm or leg weakness or numbness.  However frequent cough with secretions, on nasal cannula with oxygen saturation upper 80s.  Educated on smoking cessation and she is willing to quit.  Per PT, he walked in the hallway however with O2 sats down to low 80s.  CCM Dr. Lynetta Mare on board, probably need home oxygen on discharge.  MRI, MRA, carotid Doppler, 2D echo all negative.  Etiology for patient stroke like symptoms may be due to encephalopathy in the setting of hypoxia and COPD patient.  Will transfer out of ICU today. ? ?For detailed assessment and plan, please refer to above as I have made changes wherever appropriate.  ? ?Rosalin Hawking, MD PhD ?Stroke Neurology ?01/29/2022 ?6:04 PM ? ?This patient is critically ill due to strokelike symptoms status post tPA, tPA anaphylaxis, anaphylactic shock, respiratory distress with hypoxia and at significant risk of neurological worsening, death form bleeding from tPA, anaphylactic reaction, respiratory failure. This patient's care requires constant monitoring of vital  signs, hemodynamics, respiratory and cardiac monitoring, review of multiple databases, neurological assessment, discussion with family, other specialists and medical decision making of high complexity. I spent 35 minutes of neurocritical care time in the care of this patient. ? ? ? ?To contact Stroke Continuity provider, please refer to [email or web address] ?After hours, contact General Neurology  ?

## 2022-01-29 NOTE — Assessment & Plan Note (Signed)
1 month history of cough, particularly when lying down. Normal CXR. ? ?-Suspect COPD ?- Start bronchodilators ?- PFT 's as outpatient.  ?- Patient refuses steroids ?

## 2022-01-29 NOTE — Progress Notes (Signed)
VASCULAR LAB ? ? ? ?Carotid duplex has been performed. ? ?See CV proc for preliminary results. ? ? ?Gayatri Teasdale, RVT ?01/29/2022, 11:11 AM ? ?

## 2022-01-30 DIAGNOSIS — J9621 Acute and chronic respiratory failure with hypoxia: Secondary | ICD-10-CM

## 2022-01-30 DIAGNOSIS — Z720 Tobacco use: Secondary | ICD-10-CM

## 2022-01-30 LAB — GLUCOSE, CAPILLARY
Glucose-Capillary: 115 mg/dL — ABNORMAL HIGH (ref 70–99)
Glucose-Capillary: 137 mg/dL — ABNORMAL HIGH (ref 70–99)
Glucose-Capillary: 154 mg/dL — ABNORMAL HIGH (ref 70–99)
Glucose-Capillary: 178 mg/dL — ABNORMAL HIGH (ref 70–99)

## 2022-01-30 LAB — CBC
HCT: 35.1 % — ABNORMAL LOW (ref 36.0–46.0)
Hemoglobin: 11.7 g/dL — ABNORMAL LOW (ref 12.0–15.0)
MCH: 28.9 pg (ref 26.0–34.0)
MCHC: 33.3 g/dL (ref 30.0–36.0)
MCV: 86.7 fL (ref 80.0–100.0)
Platelets: 150 10*3/uL (ref 150–400)
RBC: 4.05 MIL/uL (ref 3.87–5.11)
RDW: 12.1 % (ref 11.5–15.5)
WBC: 11.1 10*3/uL — ABNORMAL HIGH (ref 4.0–10.5)
nRBC: 0 % (ref 0.0–0.2)

## 2022-01-30 LAB — BASIC METABOLIC PANEL
Anion gap: 9 (ref 5–15)
BUN: 10 mg/dL (ref 6–20)
CO2: 28 mmol/L (ref 22–32)
Calcium: 8.5 mg/dL — ABNORMAL LOW (ref 8.9–10.3)
Chloride: 97 mmol/L — ABNORMAL LOW (ref 98–111)
Creatinine, Ser: 0.53 mg/dL (ref 0.44–1.00)
GFR, Estimated: >60 mL/min (ref >60)
Glucose, Bld: 108 mg/dL — ABNORMAL HIGH (ref 70–99)
Potassium: 3.3 mmol/L — ABNORMAL LOW (ref 3.5–5.1)
Sodium: 134 mmol/L — ABNORMAL LOW (ref 135–145)

## 2022-01-30 MED ORDER — OXYCODONE HCL 5 MG PO TABS
5.0000 mg | ORAL_TABLET | ORAL | Status: DC | PRN
  Administered 2022-01-30 – 2022-01-31 (×6): 5 mg via ORAL
  Filled 2022-01-30 (×6): qty 1

## 2022-01-30 MED ORDER — METHYLPREDNISOLONE SODIUM SUCC 125 MG IJ SOLR
80.0000 mg | INTRAMUSCULAR | Status: DC
  Administered 2022-01-30 – 2022-01-31 (×2): 80 mg via INTRAVENOUS
  Filled 2022-01-30 (×2): qty 2

## 2022-01-30 MED ORDER — POTASSIUM CHLORIDE CRYS ER 20 MEQ PO TBCR
40.0000 meq | EXTENDED_RELEASE_TABLET | ORAL | Status: AC
  Administered 2022-01-30 (×2): 40 meq via ORAL
  Filled 2022-01-30 (×2): qty 2

## 2022-01-30 MED ORDER — DOXYCYCLINE HYCLATE 100 MG PO TABS
100.0000 mg | ORAL_TABLET | Freq: Two times a day (BID) | ORAL | Status: DC
  Administered 2022-01-30 – 2022-01-31 (×3): 100 mg via ORAL
  Filled 2022-01-30 (×3): qty 1

## 2022-01-30 MED ORDER — SODIUM CHLORIDE 0.9 % IV SOLN
2.0000 g | INTRAVENOUS | Status: DC
  Administered 2022-01-30 – 2022-01-31 (×2): 2 g via INTRAVENOUS
  Filled 2022-01-30 (×2): qty 20

## 2022-01-30 MED ORDER — PROCHLORPERAZINE EDISYLATE 10 MG/2ML IJ SOLN
10.0000 mg | Freq: Four times a day (QID) | INTRAMUSCULAR | Status: DC | PRN
  Administered 2022-01-30: 10 mg via INTRAVENOUS
  Filled 2022-01-30: qty 2

## 2022-01-30 MED ORDER — ADULT MULTIVITAMIN W/MINERALS CH
1.0000 | ORAL_TABLET | Freq: Every day | ORAL | Status: DC
Start: 2022-01-31 — End: 2022-01-31
  Administered 2022-01-31: 1 via ORAL
  Filled 2022-01-30: qty 1

## 2022-01-30 MED ORDER — ENSURE ENLIVE PO LIQD
237.0000 mL | ORAL | Status: DC
  Administered 2022-01-31: 237 mL via ORAL

## 2022-01-30 MED ORDER — FLUTICASONE FUROATE-VILANTEROL 200-25 MCG/ACT IN AEPB
1.0000 | INHALATION_SPRAY | Freq: Every day | RESPIRATORY_TRACT | Status: DC
  Administered 2022-01-30 – 2022-01-31 (×2): 1 via RESPIRATORY_TRACT
  Filled 2022-01-30: qty 28

## 2022-01-30 MED ORDER — METHYLPREDNISOLONE SODIUM SUCC 40 MG IJ SOLR
40.0000 mg | Freq: Two times a day (BID) | INTRAMUSCULAR | Status: DC
Start: 2022-01-30 — End: 2022-01-30

## 2022-01-30 NOTE — Evaluation (Signed)
Occupational Therapy Evaluation ?Patient Details ?Name: Angel Carroll ?MRN: [id number] ?DOB: 03-24-1971 ?Today's Date: 01/30/2022 ? ? ?History of Present Illness The pt is a 51 yo female presenting 4/27 with dizziness and blurry vision. Upon arrival, pt also with R arm numbness, AMS, and difficulty ambulating, tPA was administered. The pt then developed tongue swelling and progression of sx leading to intubation 4/27. Extubated 4/29. MRI 4/30 shows no acute abnormalities.  ? ?Clinical Impression ?  ?PTA pt independent with ADLs and functional mobility, lives with daughter who manages her meds, finances, and drives. Pt currently min A for ADLs, supervision for bed mobility, and min guard for transfers with RW. Pt demonstrates decreased strength (RUE >LUE) and activity tolerance, reporting fatigue after ambulating to/from bathroom. Pt presenting with impairments listed below, will follow acutely. Recommend HHOT at d/c.  ?   ? ?Recommendations for follow up therapy are one component of a multi-disciplinary discharge planning process, led by the attending physician.  Recommendations may be updated based on patient status, additional functional criteria and insurance authorization.  ? ?Follow Up Recommendations ? Home health OT  ?  ?Assistance Recommended at Discharge Intermittent Supervision/Assistance  ?Patient can return home with the following A little help with walking and/or transfers;A little help with bathing/dressing/bathroom;Assistance with cooking/housework;Direct supervision/assist for medications management;Direct supervision/assist for financial management;Assist for transportation;Help with stairs or ramp for entrance ? ?  ?Functional Status Assessment ? Patient has had a recent decline in their functional status and demonstrates the ability to make significant improvements in function in a reasonable and predictable amount of time.  ?Equipment Recommendations ? BSC/3in1 (as shower seat)  ?   ?Recommendations for Other Services   ? ? ?  ?Precautions / Restrictions Precautions ?Precautions: Fall ?Precaution Comments: Watch O2, on RA during session ?Restrictions ?Weight Bearing Restrictions: No  ? ?  ? ?Mobility Bed Mobility ?Overal bed mobility: Needs Assistance ?Bed Mobility: Supine to Sit, Sit to Supine ?  ?  ?Supine to sit: Supervision ?  ?  ?  ?  ? ?Transfers ?Overall transfer level: Needs assistance ?Equipment used: Rolling walker (2 wheels), None ?Transfers: Sit to/from Stand, Bed to chair/wheelchair/BSC ?Sit to Stand: Min guard ?  ?  ?  ?  ?  ?  ?  ? ?  ?Balance Overall balance assessment: Needs assistance ?Sitting-balance support: No upper extremity supported, Feet supported ?Sitting balance-Leahy Scale: Fair ?Sitting balance - Comments: reaches down towards feet without LOB ?  ?Standing balance support: Bilateral upper extremity supported, No upper extremity supported, During functional activity ?Standing balance-Leahy Scale: Fair ?  ?  ?  ?  ?  ?  ?  ?  ?  ?  ?  ?  ?   ? ?ADL either performed or assessed with clinical judgement  ? ?ADL Overall ADL's : Needs assistance/impaired ?Eating/Feeding: Set up;Sitting ?  ?Grooming: Set up;Sitting ?  ?Upper Body Bathing: Minimal assistance;Sitting ?  ?Lower Body Bathing: Minimal assistance;Sitting/lateral leans;Sit to/from stand ?  ?Upper Body Dressing : Minimal assistance;Standing;Sitting ?  ?Lower Body Dressing: Minimal assistance;Sitting/lateral leans;Sit to/from stand ?  ?Toilet Transfer: Min guard;Rolling walker (2 wheels);Regular Toilet;Ambulation ?  ?Toileting- Water quality scientist and Hygiene: Min guard ?  ?  ?  ?Functional mobility during ADLs: Min guard;Rolling walker (2 wheels);Cueing for safety;Cueing for sequencing ?   ? ? ? ?Vision Ability to See in Adequate Light: 0 Adequate ?Patient Visual Report: No change from baseline ?Vision Assessment?: No apparent visual deficits ?Additional Comments: pt able to track al visual  fields, reads  paragraph without difficulty without glasses. Pt states she has glasses/prescription but needs to go to eye dr  ?   ?Perception   ?  ?Praxis   ?  ? ?Pertinent Vitals/Pain Pain Assessment ?Pain Assessment: No/denies pain  ? ? ? ?Hand Dominance Right ?  ?Extremity/Trunk Assessment Upper Extremity Assessment ?Upper Extremity Assessment: Generalized weakness (RUE > LUE) ?  ?Lower Extremity Assessment ?Lower Extremity Assessment: Defer to PT evaluation ?  ?Cervical / Trunk Assessment ?Cervical / Trunk Assessment: Kyphotic ?  ?Communication Communication ?Communication: Expressive difficulties (pain from extubation) ?  ?Cognition Arousal/Alertness: Awake/alert ?  ?Overall Cognitive Status: No family/caregiver present to determine baseline cognitive functioning ?  ?  ?  ?  ?  ?  ?  ?  ?  ?  ?  ?Memory: Decreased short-term memory ?  ?  ?  ?Problem Solving: Slow processing, Decreased initiation ?General Comments: pt scoring 0 on SBT indicative of normal cognition, although needs increased time, and intonation sounding uncertain of responses ?  ?  ?General Comments  SpO2 89% and above on RA during transfer/throughout session ? ?  ?Exercises   ?  ?Shoulder Instructions    ? ? ?Home Living Family/patient expects to be discharged to:: Private residence ?Living Arrangements: Children ?Available Help at Discharge: Family;Available 24 hours/day ?Type of Home: House ?Home Access: Stairs to enter ?Entrance Stairs-Number of Steps: 1 ?Entrance Stairs-Rails: None ?Home Layout: One level ?  ?  ?Bathroom Shower/Tub: Tub/shower unit ?  ?Bathroom Toilet: Standard ?  ?  ?Home Equipment: None ?  ?Additional Comments: pt had been living with youngest daughter, plans to move in with older daughter whose house is described above ?  ? ?  ?Prior Functioning/Environment Prior Level of Function : Independent/Modified Independent;History of Falls (last six months) ?  ?  ?  ?  ?  ?  ?Mobility Comments: fell and hurt her ankle ~6 months ago, otherwise  states no falls. 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  ? ?  ?  ?OT Problem List: Decreased strength;Decreased range of motion;Decreased activity tolerance;Impaired balance (sitting and/or standing);Decreased knowledge of use of DME or AE;Cardiopulmonary status limiting activity ?  ?   ?OT Treatment/Interventions: Self-care/ADL training;Therapeutic exercise;Therapeutic activities;Cognitive remediation/compensation;Patient/family education;Balance training  ?  ?OT Goals(Current goals can be found in the care plan section) Acute Rehab OT Goals ?Patient Stated Goal: to get stronger ?OT Goal Formulation: With patient ?Time For Goal Achievement: 02/13/22 ?Potential to Achieve Goals: Good ?ADL Goals ?Pt Will Perform Upper Body Dressing: with set-up;standing;sitting ?Pt Will Perform Lower Body Dressing: with set-up;sitting/lateral leans;sit to/from stand ?Pt Will Transfer to Toilet: with modified independence;ambulating;regular height toilet ?Additional ADL Goal #1: Pt will be able to stand for 5 minutes to improve activity tolerance in prep for ADLs  ?OT Frequency: Min 3X/week ?  ? ?Co-evaluation   ?  ?  ?  ?  ? ?  ?AM-PAC OT "6 Clicks" Daily Activity     ?Outcome Measure Help from another person eating meals?: None ?Help from another person taking care of personal grooming?: A Little ?Help from another person toileting, which includes using toliet, bedpan, or urinal?: A Little ?Help from another person bathing (including washing, rinsing, drying)?: A Little ?Help from another person to put on and taking off regular upper body clothing?: A Little ?Help from another person to put on and taking off regular  lower body clothing?: A Little ?6 Click Score: 19 ?  ?End of Session Equipment Utilized During Treatment: Gait belt;Rolling walker (2 wheels) ?Nurse Communication: Mobility status ? ?Activity Tolerance: Patient tolerated  treatment well ?Patient left: in bed;with call bell/phone within reach;with bed alarm set ? ?OT Visit Diagnosis: Other abnormalities of gait and mobility (R26.89);Unsteadiness on feet (R26.81);Muscle weakness (generaliz

## 2022-01-30 NOTE — Progress Notes (Signed)
Nutrition Follow-up ? ?DOCUMENTATION CODES:  ?Not applicable ? ?INTERVENTION:  ?Liberalize diet to regular ?Ensure Enlive po 1x/d, each supplement provides 350 kcal and 20 grams of protein.  ?MVI with minerals daily ? ?NUTRITION DIAGNOSIS:  ?Inadequate oral intake related to inability to eat as evidenced by NPO status. ?- progressing, extubated, diet in place ? ?GOAL:  ?Patient will meet greater than or equal to 90% of their needs ?- progressing, diet in place, will order supplements ? ?MONITOR:  ?PO intake, Supplement acceptance, Labs, Weight trends ? ?REASON FOR ASSESSMENT:  ?Ventilator, Consult ?Enteral/tube feeding initiation and management, Assessment of nutrition requirement/status ? ?ASSESSMENT:  ?51 yo female admitted from [facility] for management of anaphylaxis to tPA and continued stroke management. She was already intubated prior to transfer to [facility]. PMH includes HTN, HLD, smoker, cannabis abuse, MDD. ? ?4/28 - admitted from outside hospital, intubated ?4/29 - extubated ? ?Unable to reach pt on room phone at this time. MRI obtained yesterday and was negative for abnormalities.  ? ?Diet advanced after intubation. No intake recorded in flowsheet. Messaged RN to inquire about intake, reports that pt was quite nauseated this AM, but meds were initiated to assist. Will liberalize diet to allow for greater intake choices in the setting of nausea. ? ?Nutritionally Relevant Medications: ?Scheduled Meds: ? doxycycline  100 mg Oral Q12H  ? insulin aspart  0-15 Units Subcutaneous TID PC & HS  ? methylPREDNISolone   80 mg Intravenous Q24H  ? rosuvastatin  20 mg Oral Daily  ? ?Continuous Infusions: ? cefTRIAXone (ROCEPHIN)  IV 2 g (01/30/22 0929)  ? ?PRN Meds: ondansetron, prochlorperazine ? ?Labs Reviewed: ?Sodium 134, Chloride 97 ?K 3.3 ?CBG ranges from 86-120 mg/dL over the last 24 hours ?HgbA1c 5.5% (4/28) ? ?NUTRITION - FOCUSED PHYSICAL EXAM: ?Defer to in-person assessment ? ?Diet Order:   ?Diet Order   ? ?        ?  Diet Heart Room service appropriate? Yes; Fluid consistency: Thin  Diet effective now       ?  ? ?  ?  ? ?  ? ? ?EDUCATION NEEDS:  ?No education needs have been identified at this time ? ?Skin:  Skin Assessment: Reviewed RN Assessment ? ?Last BM:  prior to admission ? ?Height:  ?Ht Readings from Last 1 Encounters:  ?01/29/22 5\' 1"  (1.549 m)  ? ? ?Weight:  ?Wt Readings from Last 1 Encounters:  ?01/27/22 75.7 kg  ? ? ?Ideal Body Weight:  47.7 kg ? ?BMI:  Body mass index is 31.53 kg/m?. ? ?Estimated Nutritional Needs:  ?Kcal:  1800-2000 ?Protein:  100-120 gm ?Fluid:  1.8-2 L ? ? ?01/29/22, RD, LDN ?Clinical Dietitian ?RD pager # available in AMION  ?After hours/weekend pager # available in AMION ?

## 2022-01-30 NOTE — Progress Notes (Addendum)
? ?  NAME:  Angel Carroll, MRN:  [id number], DOB:  1971/09/11, LOS: 3 ?ADMISSION DATE:  01/27/2022, CONSULTATION DATE:  01/27/22 ?REFERRING MD:  Erlinda Hong CHIEF COMPLAINT:  Respiratory failure  ? ?History of Present Illness:  ?History provided by patient's daughter. On 4/27 around 5 pm, patient called her daughter reporting dizziness and blurry vision. Daughter states that patient seemed out of it, couldn't remember what her birthday was. Daughter drove patient to ED. [facility] tele-stroke evaluated the patient and found her to have R arm numbness and difficulty ambulating. Initial CT head negative, tPA administered and patient was taken to ICU for monitoring. ? ?3 hours after administration, patient developed increasing swelling of tongue, difficulty swallowing, worsening numbness and weakness. She was subsequently intubated. She became hypotensive to MAP <65, received fluids and levophed and 1 dose of atropine for bradycardia.  ? ?Pertinent  Medical History  ?HTN ?Hypothyroidism ?Current smoker ? ?Significant Hospital Events: ?Including procedures, antibiotic start and stop dates in addition to other pertinent events   ?4/29 received in transfer from [facility] ? ?Interim History / Subjective:  ?Sore throat, back pain, and wheezing. ? ?Objective   ?Blood pressure (!) 140/58, pulse 61, temperature 99.2 ?F (37.3 ?C), temperature source Oral, resp. rate 20, height 5\' 1"  (1.549 m), weight 75.7 kg, SpO2 97 %. ?   ?FiO2 (%):  [28 %-36 %] 28 %  ? ?Intake/Output Summary (Last 24 hours) at 01/30/2022 0813 ?Last data filed at 01/29/2022 1600 ?Gross per 24 hour  ?Intake 360 ml  ?Output 450 ml  ?Net -90 ml  ? ? ?Filed Weights  ? 01/27/22 1434  ?Weight: 75.7 kg  ? ? ?Examination: ?No distress ?Lungs with wheezing ?Abd soft ?Ext warm ?Aox3, anxious ?Poor dentition ? ?Ancillary tests personally reviewed:  ?Awaiting MRI MRA ?Chest x-ray LLL aspiration ?CT head normal. ?Normal ABG.  Electrolytes normal. ?Low HDL on lipid  panel. ? ?Assessment & Plan:  ?Stroke-like syndrome but negative MRI, did receive tPA ?Tobacco abuse ?Questionable anaphylaxis to tPA ?Ongoing cough, wheezing with LLL airspace disease- would tx as AECOPD + CAP ?N/V, headache ongoing ? ?- LABA/LAMA/ICS ?- Start steroids, have spoken to her about her steroid reaction and doesn't really make sense "WBC went up, breathing was bad."  She is willing to try it. ?- Will need pulm f/u ?- Will check on tomorrow to assure heading in right direction ? ?Erskine Emery MD PCCM ?

## 2022-01-30 NOTE — Progress Notes (Addendum)
STROKE TEAM PROGRESS NOTE   INTERVAL HISTORY Patient is seen in her room with family at the bedside.  She was moved out of the ICU.  She was started on IV antibiotics and oral steroids by pulmonary team.  They also recommended follow-up outpatient.  She remains on 2 L nasal cannula.  She states her breathing is starting to feel little bit better, however she is still short of breath.  Vitals:   01/30/22 0306 01/30/22 0733 01/30/22 0905 01/30/22 1151  BP: (!) 156/87 (!) 140/58  139/73  Pulse: 81 61  80  Resp: (!) 22 20  20   Temp: 97.9 F (36.6 C) 99.2 F (37.3 C)  98.3 F (36.8 C)  TempSrc: Axillary Oral  Oral  SpO2: 93% 97% 96% 94%  Weight:      Height:       CBC:  Recent Labs  Lab 01/29/22 0524 01/30/22 0052  WBC 12.2* 11.1*  HGB 11.8* 11.7*  HCT 36.4 35.1*  MCV 89.0 86.7  PLT 145* 150    Basic Metabolic Panel:  Recent Labs  Lab  0000 01/27/22 1612 01/28/22 0457 01/28/22 0504 01/29/22 0524 01/30/22 0052  NA  --   --  138   < > 136 134*  K  --   --  3.5   < > 3.5 3.3*  CL   < >  --  108  --  104 97*  CO2   < >  --  23  --  26 28  GLUCOSE   < >  --  114*  --  87 108*  BUN   < >  --  8  --  10 10  CREATININE   < >  --  0.81  --  0.69 0.53  CALCIUM   < >  --  8.0*  --  8.2* 8.5*  MG  --  2.1 1.8  --   --   --   PHOS  --  3.6 3.4  --   --   --    < > = values in this interval not displayed.    Lipid Panel:  Recent Labs  Lab 01/27/22 1908  CHOL 129  TRIG 113  HDL 31*  CHOLHDL 4.2  VLDL 23  LDLCALC 75    HgbA1c:  Recent Labs  Lab 01/27/22 1612  HGBA1C 5.5    Urine Drug Screen: No results for input(s): LABOPIA, COCAINSCRNUR, LABBENZ, AMPHETMU, THCU, LABBARB in the last 168 hours.  Alcohol Level No results for input(s): ETH in the last 168 hours.  IMAGING past 24 hours ECHOCARDIOGRAM COMPLETE  Result Date: 01/29/2022    ECHOCARDIOGRAM REPORT   Patient Name:   KAMAIA QUIOCHO Date of Exam: 01/29/2022 Medical Rec #:  [id number]         Height:  Accession #:    [id number]        Weight:       166.9 lb Date of Birth:  March 07, 1971        BSA:          1.790 m Patient Age:    51 years          BP:           116/65 mmHg Patient Gender: F                 HR:           63 bpm. Exam Location:  Inpatient Procedure: 2D Echo, Cardiac Doppler  and Color Doppler Indications:    [place]  History:        Patient has no prior history of Echocardiogram examinations.                 Risk Factors:Current Smoker.  Sonographer:    Ross Ludwig RDCS (AE) Referring Phys: [id number] Marvel Plan IMPRESSIONS  1. Left ventricular ejection fraction, by estimation, is 60 to 65%. The left ventricle has normal function. Left ventricular endocardial border not optimally defined to evaluate regional wall motion. There is mild left ventricular hypertrophy. Left ventricular diastolic parameters were normal.  2. Right ventricular systolic function is normal. The right ventricular size is normal.  3. The mitral valve was not well visualized. Mild mitral valve regurgitation. No evidence of mitral stenosis.  4. The aortic valve was not well visualized. Aortic valve regurgitation is not visualized. No aortic stenosis is present.  5. The inferior vena cava is normal in size with <50% respiratory variability, suggesting right atrial pressure of 8 mmHg. FINDINGS  Left Ventricle: Left ventricular ejection fraction, by estimation, is 60 to 65%. The left ventricle has normal function. Left ventricular endocardial border not optimally defined to evaluate regional wall motion. The left ventricular internal cavity size was normal in size. There is mild left ventricular hypertrophy. Left ventricular diastolic parameters were normal. Right Ventricle: The right ventricular size is normal. Right vetricular wall thickness was not well visualized. Right ventricular systolic function is normal. Left Atrium: Left atrial size was normal in size. Right Atrium: Right atrial size was normal in size. Pericardium: There is  no evidence of pericardial effusion. Mitral Valve: The mitral valve was not well visualized. Mild mitral valve regurgitation. No evidence of mitral valve stenosis. Tricuspid Valve: The tricuspid valve is not well visualized. Tricuspid valve regurgitation is not demonstrated. No evidence of tricuspid stenosis. Aortic Valve: The aortic valve was not well visualized. Aortic valve regurgitation is not visualized. No aortic stenosis is present. Aortic valve mean gradient measures 6.0 mmHg. Aortic valve peak gradient measures 11.7 mmHg. Aortic valve area, by VTI measures 2.13 cm. Pulmonic Valve: The pulmonic valve was not well visualized. Pulmonic valve regurgitation is not visualized. No evidence of pulmonic stenosis. Aorta: The aortic root is normal in size and structure. Venous: The inferior vena cava is normal in size with less than 50% respiratory variability, suggesting right atrial pressure of 8 mmHg. IAS/Shunts: No atrial level shunt detected by color flow Doppler.  LEFT VENTRICLE PLAX 2D LVIDd:         4.50 cm   Diastology LVIDs:         2.60 cm   LV e' medial:    8.70 cm/s LV PW:         1.20 cm   LV E/e' medial:  9.3 LV IVS:        1.20 cm   LV e' lateral:   18.50 cm/s LVOT diam:     1.80 cm   LV E/e' lateral: 4.4 LV SV:         77 LV SV Index:   43 LVOT Area:     2.54 cm  RIGHT VENTRICLE             IVC RV Basal diam:  2.90 cm     IVC diam: 2.00 cm RV S prime:     14.90 cm/s TAPSE (M-mode): 2.5 cm LEFT ATRIUM           Index  RIGHT ATRIUM           Index LA diam:      3.20 cm 1.79 cm/m   RA Area:     10.60 cm LA Vol (A2C): 25.6 ml 14.30 ml/m  RA Volume:   21.90 ml  12.24 ml/m LA Vol (A4C): 24.2 ml 13.52 ml/m  AORTIC VALVE AV Area (Vmax):    2.16 cm AV Area (Vmean):   2.14 cm AV Area (VTI):     2.13 cm AV Vmax:           171.00 cm/s AV Vmean:          116.000 cm/s AV VTI:            0.363 m AV Peak Grad:      11.7 mmHg AV Mean Grad:      6.0 mmHg LVOT Vmax:         145.00 cm/s LVOT Vmean:         97.500 cm/s LVOT VTI:          0.304 m LVOT/AV VTI ratio: 0.84  AORTA Ao Root diam: 2.90 cm Ao Asc diam:  3.40 cm MITRAL VALVE MV Area (PHT): 2.30 cm    SHUNTS MV Decel Time: 330 msec    Systemic VTI:  0.30 m MV E velocity: 80.70 cm/s  Systemic Diam: 1.80 cm MV A velocity: 83.80 cm/s MV E/A ratio:  0.96 Dina Rich MD Electronically signed by Dina Rich MD Signature Date/Time: 01/29/2022/5:08:09 PM    Final     PHYSICAL EXAM General:  Alert, well-developed, obese patient in no acute distress Respiratory:  Respirations regular and unlabored on supplemental O2  NEURO:  Mental Status: AA&Ox3  Speech/Language: speech is without dysarthria or aphasia.  Naming, repetition, fluency, and comprehension intact.  Cranial Nerves:  II: PERRL. Visual fields full.  III, IV, VI: EOMI. Eyelids elevate symmetrically.  V: Sensation is intact to light touch and symmetrical to face.  VII: Smile is symmetrical.   VIII: hearing intact to voice. IX, X: Phonation is normal.  ON:GEXBMWUX shrug 5/5. XII: tongue is midline without fasciculations. Motor: 5/5 strength to all muscle groups tested.  Tone: is normal and bulk is normal Sensation- Intact to light touch bilaterally.  Coordination: FTN intact bilaterally.No drift.  Gait- deferred  ASSESSMENT/PLAN [name] is a 51 y.o. female with history of HTN, HLD, tobacco abuse, cannabis abuse and MDD presenting with acute onset dizziness, diplopia, confusion, headache and expressive aphasia.  She initially presented to an outside hospital. She was given tPA to treat a possible stroke and the experienced oral and facial swelling with respiratory compromise.  She then was intubated and placed on norepi for hypotension.  Patient and family refused steroid treatment for allergic reaction, so she was treated with diphenhydramine.  She was transferred here for further management.  She has been extubated and is doing well.  MRI shows no acute infarct.MRI  shows no LVO or significant stenosis.  She will likely need home oxygen at discharge.  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  MRA unremarkable MRI no acute infarct Carotid Doppler negative 2D Echo EF 60-65% LDL 75 HgbA1c 5.5 VTE prophylaxis -Lovenox No antithrombotic prior to admission, now on ASA 81mg  for primary stroke prevention Therapy recommendations: Outpatient PT/OT Disposition:  pending  Anaphylactic shock s/p tPA History of Hypertension, now hypotensive s/p tPA Home meds:  amlodipine 10 mg daily, lisinopril 40 mg daily Was requiring norepi->now off BP improved MAP goal >65 Long-term BP goal normotensive  Hyperlipidemia Home meds:  none LDL 75, goal < 70 Add rosuvastatin 20 mg daily  Continue statin at discharge  Respiratory failure Hypoxia with exertion 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 Katrinka Blazing Likely need home O2 on discharge Pt now willing to try steroids  Tobacco abuse Current smoker Smoking cessation counseling provided Pt is willing to quit  Other Stroke Risk Factors Obesity  Other Active Problems tPA anaphylaxis with oral pharyngeal swelling - family against steroids due to previous behavioral side effect with steroids  Hospital day # 3  Patient seen and examined by NP/APP with MD. MD to update note as needed.   Elmer Picker, DNP, FNP-BC [facility] Pager: [phone]  ATTENDING NOTE: I reviewed above note and agree with the assessment and plan. Pt was seen and examined.   Pt sitting at the edge of bed, no acute event overnight. No complains. Still on [place], saturation is fine. CCM Dr. Katrinka Blazing on board and she is willing to try steroids. Pending home O2 arrangement. Plan to d/c in am.   For detailed assessment and plan, please refer to  above as I have made changes wherever appropriate.   Marvel Plan, MD PhD Stroke Neurology 01/30/2022 5:40 PM   To contact Stroke Continuity provider, please refer to [email or web address]. After hours, contact General Neurology

## 2022-01-30 NOTE — Progress Notes (Signed)
Physical Therapy Treatment ?Patient Details ?Name: Angel Carroll ?MRN: [id number] ?DOB: April 15, 1971 ?Today's Date: 01/30/2022 ? ? ?History of Present Illness The pt is a 51 yo female presenting 4/27 with dizziness and blurry vision. Upon arrival, pt also with R arm numbness, AMS, and difficulty ambulating, tPA was administered. The pt then developed tongue swelling and progression of sx leading to intubation 4/27. Extubated 4/29. MRI 4/30 shows no acute abnormalities. ? ?  ?PT Comments  ? ? Focus of session was continued functional mobility, transfer training, gait, and balance training. The patient tolerated the session well but was limited by energy levels. She was able to get EOB min guard and perform transfers min guard. She walked 25 feet with RW, then progressed to stepping forward and backward w/o RW and remained steady. She was able to perform a secondary STS and step pivot transfer min guard w/o AD to get back to bed at the end of the session, she has chronic low back pain that limited her ability to tolerate sitting in the chair. Overall she is requiring less assist for functional movements. Her SpO2 was from 88-92% on RA, with only a brief time spent below 90%. She had no complaints of SOB during the session. Pt would benefit from continued PT services to maximize her transfer, gait, balance, and stair navigation to maximize independence at home. Plan and d/c recommendations remain unchanged. PT to follow up acutely as able.  ?   ?Recommendations for follow up therapy are one component of a multi-disciplinary discharge planning process, led by the attending physician.  Recommendations may be updated based on patient status, additional functional criteria and insurance authorization. ? ?Follow Up Recommendations ? Home health PT ?  ?  ?Assistance Recommended at Discharge Frequent or constant Supervision/Assistance  ?Patient can return home with the following A little help with walking and/or transfers;A  little help with bathing/dressing/bathroom;Assistance with cooking/housework;Assist for transportation;Help with stairs or ramp for entrance;Direct supervision/assist for financial management;Direct supervision/assist for medications management ?  ?Equipment Recommendations ? Rolling walker (2 wheels)  ?  ?   ?Precautions / Restrictions Precautions ?Precautions: Fall ?Precaution Comments: Watch O2, on RA during session ?Restrictions ?Weight Bearing Restrictions: No  ?  ? ?Mobility ? Bed Mobility ?Overal bed mobility: Needs Assistance ?Bed Mobility: Supine to Sit, Sit to Supine ?  ?  ?Supine to sit: Min guard ?Sit to supine: Min guard ?  ?General bed mobility comments: Min guard for line management ?Patient Response: Impulsive ? ?Transfers ?Overall transfer level: Needs assistance ?Equipment used: Rolling walker (2 wheels), None ?Transfers: Sit to/from Stand, Bed to chair/wheelchair/BSC ?Sit to Stand: Min guard ?  ?Step pivot transfers: Min guard ?  ?  ?  ?General transfer comment: Min guard to power up with RW on first STS, min guard with no AD on second STS, min guard w/o no AD for step pivot ?  ? ?Ambulation/Gait ?Ambulation/Gait assistance: Min guard ?Gait Distance (Feet): 25 Feet ?Assistive device: Rolling walker (2 wheels) ?Gait Pattern/deviations: Step-through pattern, Decreased stride length, Decreased dorsiflexion - left, Shuffle, Scissoring, Narrow base of support ?Gait velocity: decreased ?  ?Pre-gait activities: Standing marches and multidirectional leg swings with RW, standing marches w/o RW ?General Gait Details: Norrow BOS even when cued differently. Pt able to take a few steps fwd/bkwd w/o AD and looks steady. Continued to walk with a narrow BOS. She performs a quick turn during gait and remains stable. ? ? ? ?Modified Rankin (Stroke Patients Only) ?Modified Rankin (Stroke Patients Only) ?  Pre-Morbid Rankin Score: No symptoms ?Modified Rankin: Moderately severe disability ? ? ?  ?Balance Overall  balance assessment: Needs assistance ?Sitting-balance support: No upper extremity supported, Feet supported ?Sitting balance-Leahy Scale: Fair ?Sitting balance - Comments: Able to sit EOB and chair w/o issue ?  ?Standing balance support: Bilateral upper extremity supported, No upper extremity supported, During functional activity ?Standing balance-Leahy Scale: Fair ?Standing balance comment: Pt begins to walk with RW, then progresses to taking steps and transfering w/o. ?  ?  ?  ?  ?  ?  ?  ?  ?  ?  ?  ?  ? ?  ?Cognition Arousal/Alertness: Awake/alert ?Behavior During Therapy: Impulsive ?Overall Cognitive Status: Impaired/Different from baseline ?Area of Impairment: Safety/judgement, Awareness ?  ?  ?  ?  ?  ?  ?  ?  ?  ?  ?  ?  ?Safety/Judgement: Decreased awareness of safety, Decreased awareness of deficits ?Awareness: Emergent ?  ?General Comments: Pt generally impulsive, shows improved processing, but still with limited saftey awareness. ?  ?  ? ?  ?   ?General Comments General comments (skin integrity, edema, etc.): SPO2 remaing around 90-92% on RA for the majority of the session. Dropped to 88% breifly after transfer but quickly recovered. Pt. left of RA. ?  ?  ? ?Pertinent Vitals/Pain Pain Assessment ?Pain Assessment: Faces ?Faces Pain Scale: Hurts a little bit ?Pain Location: General back pain ?Pain Descriptors / Indicators: Grimacing, Guarding ?Pain Intervention(s): Monitored during session, Repositioned, Limited activity within patient's tolerance  ? ? ? ?PT Goals (current goals can now be found in the care plan section) Acute Rehab PT Goals ?Patient Stated Goal: return home ?PT Goal Formulation: With patient ?Time For Goal Achievement: 02/12/22 ?Potential to Achieve Goals: Good ? ?  ?Frequency ? ? ? Min 4X/week ? ? ? ?  ? ?AM-PAC PT "6 Clicks" Mobility   ?Outcome Measure ? Help needed turning from your back to your side while in a flat bed without using bedrails?: None ?Help needed moving from lying on  your back to sitting on the side of a flat bed without using bedrails?: A Little ?Help needed moving to and from a bed to a chair (including a wheelchair)?: A Little ?Help needed standing up from a chair using your arms (e.g., wheelchair or bedside chair)?: A Little ?Help needed to walk in hospital room?: A Little ?Help needed climbing 3-5 steps with a railing? : A Little ?6 Click Score: 19 ? ?  ?End of Session Equipment Utilized During Treatment: Gait belt ?Activity Tolerance: Patient limited by fatigue ?Patient left: in bed;with call bell/phone within reach;with bed alarm set ?Nurse Communication: Mobility status ?PT Visit Diagnosis: Other abnormalities of gait and mobility (R26.89);Muscle weakness (generalized) (M62.81);Ataxic gait (R26.0) ?  ? ? ?Time: [id number] ?PT Time Calculation (min) (ACUTE ONLY): 36 min ? ?Charges:  $Therapeutic Activity: 8-22 mins ?$Neuromuscular Re-education: 8-22 mins          ?          ? ?Thermon Leyland, SPT ?Acute Rehab Services ? ? ? ?Thermon Leyland ?01/30/2022, 4:13 PM ? ?

## 2022-01-30 NOTE — TOC Initial Note (Signed)
Transition of Care (TOC) - Initial/Assessment Note  ? ? ?Patient Details  ?Name: Angel Carroll ?MRN: [id number] ?Date of Birth: 03/26/1971 ? ?Transition of Care (TOC) CM/SW Contact:    ?Kermit Balo, RN ?Phone Number: ?01/30/2022, 11:25 AM ? ?Clinical Narrative:                 ?Patient is from home with daughter. She was not using any DME at home. PCP: Dr Doris Cheadle.  ?She denies any issues with home medications other than she didn't feel her BP was managed well with her home medications.  ?No transportation needs.  ?Recommendations are for [facility] services. Pt's daughter prefers outpatient therapy at [facility] in [place]. CM has called and will fax patient's information to: [phone] ?Patient has medicaid through [place]. CM has asked financial counseling to see if they can find the information as family doesn't have the card.  ?Walker recommended for home. CM will have delivered to the room prior to d/c.  ?TOC following. ? ?Expected Discharge Plan: OP Rehab ?Barriers to Discharge: Continued Medical Work up ? ? ?Patient Goals and CMS Choice ?  ?  ?Choice offered to / list presented to : Patient, Adult Children ? ?Expected Discharge Plan and Services ?Expected Discharge Plan: OP Rehab ?  ?Discharge Planning Services: CM Consult ?  ?Living arrangements for the past 2 months: Single Family Home ?                ?DME Arranged: Walker rolling ?  ?  ?  ?  ?  ?  ?  ?  ?  ? ?Prior Living Arrangements/Services ?Living arrangements for the past 2 months: Single Family Home ?Lives with:: Adult Children ?Patient language and need for interpreter reviewed:: Yes ?Do you feel safe going back to the place where you live?: Yes      ?Need for Family Participation in Patient Care: Yes (Comment) ?Care giver support system in place?: Yes (comment) ?  ?Criminal Activity/Legal Involvement Pertinent to Current Situation/Hospitalization: No - Comment as needed ? ?Activities of Daily Living ?Home Assistive Devices/Equipment:  None ?ADL Screening (condition at time of admission) ?Patient's cognitive ability adequate to safely complete daily activities?: Yes ?Is the patient deaf or have difficulty hearing?: No ?Does the patient have difficulty seeing, even when wearing glasses/contacts?: No ?Does the patient have difficulty concentrating, remembering, or making decisions?: No ?Patient able to express need for assistance with ADLs?: Yes ?Does the patient have difficulty dressing or bathing?: No ?Independently performs ADLs?: Yes (appropriate for developmental age) ?Does the patient have difficulty walking or climbing stairs?: Yes ?Weakness of Legs: Both ?Weakness of Arms/Hands: None ? ?Permission Sought/Granted ?  ?  ?   ?   ?   ?   ? ?Emotional Assessment ?Appearance:: Appears stated age ?  ?Affect (typically observed): Accepting ?Orientation: : Oriented to Self, Oriented to Place, Oriented to  Time, Oriented to Situation ?  ?Psych Involvement: No (comment) ? ?Admission diagnosis:  Stroke (cerebrum) (HCC) [I63.9] ?Patient Active Problem List  ? Diagnosis Date Noted  ? COPD (chronic obstructive pulmonary disease) (HCC) 01/29/2022  ? 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 Respiratory failure (HCC) 01/27/2022  ? History of hypertension   ? Tobacco abuse   ? ?PCP:  Wess Botts, FNP ?Pharmacy:   ?[id number] [place] - [address] ?[address] ?[place] [address] ?Phone: [phone] Fax: [phone] ? ? ? ? ?Social Determinants of Health (SDOH) Interventions ?  ? ?Readmission Risk  Interventions ?   ? View : No data to display.  ?  ?  ?  ? ? ? ?

## 2022-01-31 DIAGNOSIS — J449 Chronic obstructive pulmonary disease, unspecified: Secondary | ICD-10-CM

## 2022-01-31 DIAGNOSIS — Z8679 Personal history of other diseases of the circulatory system: Secondary | ICD-10-CM

## 2022-01-31 LAB — GLUCOSE, CAPILLARY
Glucose-Capillary: 117 mg/dL — ABNORMAL HIGH (ref 70–99)
Glucose-Capillary: 126 mg/dL — ABNORMAL HIGH (ref 70–99)
Glucose-Capillary: 126 mg/dL — ABNORMAL HIGH (ref 70–99)

## 2022-01-31 LAB — BASIC METABOLIC PANEL
Anion gap: 7 (ref 5–15)
BUN: 9 mg/dL (ref 6–20)
CO2: 28 mmol/L (ref 22–32)
Calcium: 9.2 mg/dL (ref 8.9–10.3)
Chloride: 101 mmol/L (ref 98–111)
Creatinine, Ser: 0.73 mg/dL (ref 0.44–1.00)
GFR, Estimated: >60 mL/min (ref >60)
Glucose, Bld: 120 mg/dL — ABNORMAL HIGH (ref 70–99)
Potassium: 3.9 mmol/L (ref 3.5–5.1)
Sodium: 136 mmol/L (ref 135–145)

## 2022-01-31 LAB — CBC
HCT: 34.5 % — ABNORMAL LOW (ref 36.0–46.0)
Hemoglobin: 11.9 g/dL — ABNORMAL LOW (ref 12.0–15.0)
MCH: 29.2 pg (ref 26.0–34.0)
MCHC: 34.5 g/dL (ref 30.0–36.0)
MCV: 84.8 fL (ref 80.0–100.0)
Platelets: 185 10*3/uL (ref 150–400)
RBC: 4.07 MIL/uL (ref 3.87–5.11)
RDW: 11.9 % (ref 11.5–15.5)
WBC: 13.5 10*3/uL — ABNORMAL HIGH (ref 4.0–10.5)
nRBC: 0 % (ref 0.0–0.2)

## 2022-01-31 MED ORDER — ASPIRIN 81 MG PO TBEC: 81.0000 mg | DELAYED_RELEASE_TABLET | Freq: Every day | ORAL | 11 refills | Status: AC

## 2022-01-31 MED ORDER — CEFDINIR 300 MG PO CAPS: 300.0000 mg | ORAL_CAPSULE | Freq: Two times a day (BID) | ORAL | 0 refills | Status: AC

## 2022-01-31 MED ORDER — ROSUVASTATIN CALCIUM 20 MG PO TABS: 20.0000 mg | ORAL_TABLET | Freq: Every day | ORAL | 1 refills | Status: AC

## 2022-01-31 MED ORDER — DOXYCYCLINE HYCLATE 100 MG PO TABS: 100.0000 mg | ORAL_TABLET | Freq: Two times a day (BID) | ORAL | 0 refills | Status: AC

## 2022-01-31 MED ORDER — CEFDINIR 300 MG PO CAPS
300.0000 mg | ORAL_CAPSULE | Freq: Two times a day (BID) | ORAL | Status: DC
  Administered 2022-01-31: 300 mg via ORAL
  Filled 2022-01-31 (×2): qty 1

## 2022-01-31 MED ORDER — PREDNISONE 20 MG PO TABS: 40.0000 mg | ORAL_TABLET | Freq: Every day | ORAL | 0 refills | Status: AC

## 2022-01-31 MED ORDER — UMECLIDINIUM BROMIDE 62.5 MCG/ACT IN AEPB: 1.0000 | INHALATION_SPRAY | Freq: Every day | RESPIRATORY_TRACT | 1 refills | Status: AC

## 2022-01-31 MED ORDER — BUPROPION HCL 75 MG PO TABS: 150.0000 mg | ORAL_TABLET | Freq: Two times a day (BID) | ORAL | 1 refills | Status: AC

## 2022-01-31 MED ORDER — FLUTICASONE FUROATE-VILANTEROL 200-25 MCG/ACT IN AEPB: 1.0000 | INHALATION_SPRAY | Freq: Every day | RESPIRATORY_TRACT | 1 refills | Status: AC

## 2022-01-31 MED ORDER — PREDNISONE 20 MG PO TABS: 40.0000 mg | ORAL_TABLET | Freq: Every day | ORAL | Status: DC

## 2022-01-31 NOTE — Discharge Summary (Addendum)
Stroke Discharge Summary  ?Patient ID: Angel Carroll    l ?  MRN: [id number]    ?  DOB: 1971/09/16 ? ?Date of Admission: 01/27/2022 ?Date of Discharge: 01/31/2022 ? ?Attending Physician:  Rosalin Hawking MD ?Consultant(s):    pulmonary/intensive care  ?Patient's PCP:  Rocco Serene, FNP ? ?DISCHARGE DIAGNOSIS:  ?Stroke-like symptoms s/p tPA, possible stroke mimic due to hypoxia and encephalopathy secondary to COPD in smoker ?Anaphylactic shock s/p tPA ? ?Active Problems: ?  Respiratory failure ([place]) ?  hypertension ?  Tobacco abuse ?  COPD (chronic obstructive pulmonary disease) ([place]) ?  smoker ? ? ?Allergies as of 01/31/2022   ? ?   Reactions  ? T-pa [alteplase] Anaphylaxis  ? Nsaids   ? Unknown   ? ?  ? ?  ?Medication List  ?  ? ?TAKE these medications   ? ?acetaminophen 500 MG tablet ?Commonly known as: TYLENOL ?Take 1,000 mg by mouth every 6 (six) hours as needed for mild pain. ?  ?amLODipine 10 MG tablet ?Commonly known as: NORVASC ?Take 10 mg by mouth daily. ?  ?aspirin 81 MG EC tablet ?Take 1 tablet (81 mg total) by mouth daily. Swallow whole. ?Start taking on: Feb 01, 2022 ?  ?buPROPion 75 MG tablet ?Commonly known as: WELLBUTRIN ?Take 2 tablets (150 mg total) by mouth 2 (two) times daily. ?  ?cefdinir 300 MG capsule ?Commonly known as: OMNICEF ?Take 1 capsule (300 mg total) by mouth every 12 (twelve) hours. ?  ?doxycycline 100 MG tablet ?Commonly known as: VIBRA-TABS ?Take 1 tablet (100 mg total) by mouth every 12 (twelve) hours. ?  ?fluticasone furoate-vilanterol 200-25 MCG/ACT Aepb ?Commonly known as: BREO ELLIPTA ?Inhale 1 puff into the lungs daily. ?Start taking on: Feb 01, 2022 ?  ?lisinopril 40 MG tablet ?Commonly known as: ZESTRIL ?Take 40 mg by mouth daily. ?  ?oxybutynin 5 MG 24 hr tablet ?Commonly known as: DITROPAN-XL ?Take 5 mg by mouth at bedtime. ?  ?predniSONE 20 MG tablet ?Commonly known as: DELTASONE ?Take 2 tablets (40 mg total) by mouth daily with breakfast. ?Start taking on: Feb 01, 2022 ?   ?rosuvastatin 20 MG tablet ?Commonly known as: CRESTOR ?Take 1 tablet (20 mg total) by mouth daily. ?Start taking on: Feb 01, 2022 ?  ?traZODone 150 MG tablet ?Commonly known as: DESYREL ?Take 150 mg by mouth at bedtime. ?  ?umeclidinium bromide 62.5 MCG/ACT Aepb ?Commonly known as: INCRUSE ELLIPTA ?Inhale 1 puff into the lungs daily. ?Start taking on: Feb 01, 2022 ?  ? ?  ? ?  ?  ? ? ?  ?Durable Medical Equipment  ?(From admission, onward)  ?  ? ? ?  ? ?  Start     Ordered  ? 01/30/22 1323  For home use only DME Walker rolling  Once       ?Question Answer Comment  ?Walker: With 5 Inch Wheels   ?Patient needs a walker to treat with the following condition Weakness   ?  ? 01/30/22 1322  ? 01/29/22 1701  For home use only DME oxygen  Once       ?Question Answer Comment  ?Length of Need 6 Months   ?Mode or (Route) Nasal cannula   ?Liters per Minute 2   ?Frequency Continuous (stationary and portable oxygen unit needed)   ?Oxygen delivery system Gas   ?  ? 01/29/22 1700  ? ?  ?  ? ?  ? ? ?LABORATORY STUDIES ?CBC ?   ?Component  Value Date/Time  ? WBC 13.5 (H) 01/31/2022 0245  ? RBC 4.07 01/31/2022 0245  ? HGB 11.9 (L) 01/31/2022 0245  ? HCT 34.5 (L) 01/31/2022 0245  ? PLT 185 01/31/2022 0245  ? MCV 84.8 01/31/2022 0245  ? MCH 29.2 01/31/2022 0245  ? MCHC 34.5 01/31/2022 0245  ? RDW 11.9 01/31/2022 0245  ? ?CMP ?   ?Component Value Date/Time  ? NA 136 01/31/2022 0245  ? K 3.9 01/31/2022 0245  ? CL 101 01/31/2022 0245  ? CO2 28 01/31/2022 0245  ? GLUCOSE 120 (H) 01/31/2022 0245  ? BUN 9 01/31/2022 0245  ? CREATININE 0.73 01/31/2022 0245  ? CALCIUM 9.2 01/31/2022 0245  ? GFRNONAA >60 01/31/2022 0245  ? ?COAGSNo results found for: INR, PROTIME ?Lipid Panel ?   ?Component Value Date/Time  ? CHOL 129 01/27/2022 1908  ? TRIG 113 01/27/2022 1908  ? HDL 31 (L) 01/27/2022 1908  ? CHOLHDL 4.2 01/27/2022 1908  ? VLDL 23 01/27/2022 1908  ? [place] 75 01/27/2022 1908  ? ?HgbA1C  ?Lab Results  ?Component Value Date  ? HGBA1C 5.5  01/27/2022  ? ?Urinalysis ?No results found for: COLORURINE, APPEARANCEUR, [place], [place], [place], [place], [place], KETONESUR, PROTEINUR, [place], NITRITE, LEUKOCYTESUR ?Urine Drug Screen No results found for: LABOPIA, COCAINSCRNUR, [place], [place], THCU, LABBARB  ?Alcohol Level ?No results found for: ETH ? ? ?SIGNIFICANT DIAGNOSTIC STUDIES ?CT HEAD WO CONTRAST (5MM) ? ?Result Date: 01/27/2022 ?CLINICAL DATA:  Dizziness and blurry vision, stroke suspected EXAM: CT HEAD WITHOUT CONTRAST TECHNIQUE: Contiguous axial images were obtained from the base of the skull through the vertex without intravenous contrast. RADIATION DOSE REDUCTION: This exam was performed according to the departmental dose-optimization program which includes automated exposure control, adjustment of the mA and/or kV according to patient size and/or use of iterative reconstruction technique. COMPARISON:  None. FINDINGS: Brain: No evidence of acute infarction, hemorrhage, cerebral edema, mass, mass effect, or midline shift. No hydrocephalus or extra-axial fluid collection. Vascular: No hyperdense vessel. Skull: Normal. Negative for fracture or focal lesion. Sinuses/Orbits: Minimal mucosal thickening in the right maxillary sinus and posterior ethmoid air cells. The orbits are unremarkable. Other: The mastoid air cells are well aerated. IMPRESSION: IMPRESSION No acute intracranial process. Electronically Signed   By: Merilyn Baba M.D.   On: 01/27/2022 20:44  ? ?MR ANGIO HEAD WO CONTRAST ? ?Result Date: 01/29/2022 ?CLINICAL DATA:  Stroke, follow up EXAM: MRI HEAD WITHOUT CONTRAST MRA HEAD WITHOUT CONTRAST TECHNIQUE: Multiplanar, multi-echo pulse sequences of the brain and surrounding structures were acquired without intravenous contrast. Angiographic images of the Circle of Willis were acquired using MRA technique without intravenous contrast. COMPARISON:  No pertinent prior exam. FINDINGS: MRI HEAD Brain: There is no acute infarction or  intracranial hemorrhage. There is no intracranial mass, mass effect, or edema. There is no hydrocephalus or extra-axial fluid collection. Ventricles and sulci are normal in size and configuration. Patchy foci of T2 hyperintensity in the supratentorial white matter are nonspecific but may reflect mild chronic microvascular ischemic changes. Vascular: Major vessel flow voids at the skull base are preserved. Skull and upper cervical spine: Normal marrow signal is preserved. Sinuses/Orbits: Minor mucosal thickening.  Orbits are unremarkable. Other: Sella is unremarkable. Mild patchy mastoid fluid opacification. MRA HEAD Intracranial internal carotid arteries are patent. Middle and anterior cerebral arteries are patent. Intracranial vertebral arteries, basilar artery, posterior cerebral arteries are patent. Bilateral posterior communicating arteries are present. There is no significant stenosis or aneurysm. IMPRESSION: No acute infarction, hemorrhage, or mass.  Probable mild chronic microvascular ischemic changes. No proximal intracranial vessel occlusion or significant stenosis. Electronically Signed   By: Macy Mis M.D.   On: 01/29/2022 13:36  ? ?MR BRAIN WO CONTRAST ? ?Result Date: 01/29/2022 ?CLINICAL DATA:  Stroke, follow up EXAM: MRI HEAD WITHOUT CONTRAST MRA HEAD WITHOUT CONTRAST TECHNIQUE: Multiplanar, multi-echo pulse sequences of the brain and surrounding structures were acquired without intravenous contrast. Angiographic images of the Circle of Willis were acquired using MRA technique without intravenous contrast. COMPARISON:  No pertinent prior exam. FINDINGS: MRI HEAD Brain: There is no acute infarction or intracranial hemorrhage. There is no intracranial mass, mass effect, or edema. There is no hydrocephalus or extra-axial fluid collection. Ventricles and sulci are normal in size and configuration. Patchy foci of T2 hyperintensity in the supratentorial white matter are nonspecific but may reflect mild  chronic microvascular ischemic changes. Vascular: Major vessel flow voids at the skull base are preserved. Skull and upper cervical spine: Normal marrow signal is preserved. Sinuses/Orbits: Minor mucosal thickenin

## 2022-01-31 NOTE — Progress Notes (Signed)
? ?  NAME:  Angel Carroll, MRN:  [id number], DOB:  [birth year], LOS: 4 ?ADMISSION DATE:  01/27/2022, CONSULTATION DATE:  01/27/22 ?REFERRING MD:  Roda Shutters CHIEF COMPLAINT:  Respiratory failure  ? ?History of Present Illness:  ?History provided by patient's daughter. On 4/27 around 5 pm, patient called her daughter reporting dizziness and blurry vision. Daughter states that patient seemed out of it, couldn't remember what her birthday was. Daughter drove patient to ED. [facility] tele-stroke evaluated the patient and found her to have R arm numbness and difficulty ambulating. Initial CT head negative, tPA administered and patient was taken to ICU for monitoring. ? ?3 hours after administration, patient developed increasing swelling of tongue, difficulty swallowing, worsening numbness and weakness. She was subsequently intubated. She became hypotensive to MAP <65, received fluids and levophed and 1 dose of atropine for bradycardia.  ? ?Pertinent  Medical History  ?HTN ?Hypothyroidism ?Current smoker ? ?Significant Hospital Events: ?Including procedures, antibiotic start and stop dates in addition to other pertinent events   ?4/29 received in transfer from [facility] ? ?Interim History / Subjective:  ?Doing much better. ?Wants to go home.  Sats low 90s on RA. ? ?Objective   ?Blood pressure 117/65, pulse 73, temperature 98.4 ?F (36.9 ?C), temperature source Oral, resp. rate 18, height 5\' 1"  (1.549 m), weight 75.7 kg, SpO2 91 %. ?   ?   ? ?Intake/Output Summary (Last 24 hours) at 01/31/2022 1034 ?Last data filed at 01/30/2022 04/01/2022 ?Gross per 24 hour  ?Intake 21.6 ml  ?Output --  ?Net 21.6 ml  ? ? ?Filed Weights  ? 01/27/22 1434  ?Weight: 75.7 kg  ? ? ?Examination: ?No distress ?Lungs more clear ?Abdomen soft ?Ext without edema ?Aox3 ? ?Ancillary tests personally reviewed:  ?Awaiting MRI MRA ?Chest x-ray LLL aspiration ?CT head normal. ?Normal ABG.  Electrolytes normal. ?Low HDL on lipid panel. ? ?Assessment & Plan:   ?Stroke-like syndrome but negative MRI, did receive tPA ?Tobacco abuse ?Questionable anaphylaxis to tPA ?Ongoing cough, wheezing with LLL airspace disease- would tx as AECOPD + CAP, improved ?N/V, headache resolved ? ?- Send home on trelegy, cefdinir and prednisone.  I placed 5 day order of cefinir and prednisone.   Trelegy not on our formulary but should be on 01/29/22 medicaid. ?- Removed steroids from her allergy list, she tolerated fine ?- Ok to do walking pulse ox, if asymptomatic but desatting to 80s you are okay to discharge unless she wants the oxygen (no survival or hospitalization benefit) ?- She states she will establish care with pulmonologist in [place], [place] on discharge ?- Available PRN ? ?[place] MD PCCM ?

## 2022-01-31 NOTE — Progress Notes (Signed)
Discharge instructions given. Patient verbalized understanding and all questions were answered.  ?

## 2022-01-31 NOTE — Progress Notes (Signed)
Physical Therapy Treatment ?Patient Details ?Name: Angel Carroll ?MRN: [id number] ?DOB: 04/08/1971 ?Today's Date: 01/31/2022 ? ? ?History of Present Illness The pt is a 51 yo female presenting 4/27 with dizziness and blurry vision. Upon arrival, pt also with R arm numbness, AMS, and difficulty ambulating, tPA was administered. The pt then developed tongue swelling and progression of sx leading to intubation 4/27. Extubated 4/29. MRI 4/30 shows no acute abnormalities. ? ?  ?PT Comments  ? ? Pt with significant improvement today. Pt able to amb 150' with RW and min guard assist. Attempted amb with L HHA however pt more unsteady and requiring increased assist. Pt's SpO2 >90% on RA t/o session today. Pt with good home set up and support. Pt to benefit from use of RW for safe ambulation in addition to outpt PT to progress pt back to indep function without AD. Acute PT to cont to follow. ? ? ?SATURATION QUALIFICATIONS: (This note is used to comply with regulatory documentation for home oxygen) ? ?Patient Saturations on Room Air at Rest = 94% ? ?Patient Saturations on Room Air while Ambulating = 90% ? ?Patient Saturations on 0 Liters of oxygen while Ambulating = 90% ? ?Please briefly explain why patient needs home oxygen: doesn't need home O2, pt SpO2 >90% on RA t/o ambulation and at rest ?  ?Recommendations for follow up therapy are one component of a multi-disciplinary discharge planning process, led by the attending physician.  Recommendations may be updated based on patient status, additional functional criteria and insurance authorization. ? ?Follow Up Recommendations ? Outpatient PT ?  ?  ?Assistance Recommended at Discharge Frequent or constant Supervision/Assistance  ?Patient can return home with the following A little help with walking and/or transfers;A little help with bathing/dressing/bathroom;Assistance with cooking/housework;Assist for transportation;Help with stairs or ramp for entrance;Direct  supervision/assist for financial management;Direct supervision/assist for medications management ?  ?Equipment Recommendations ? Rolling walker (2 wheels)  ?  ?Recommendations for Other Services   ? ? ?  ?Precautions / Restrictions Precautions ?Precautions: Fall ?Restrictions ?Weight Bearing Restrictions: No  ?  ? ?Mobility ? Bed Mobility ?  ?  ?  ?  ?  ?  ?  ?General bed mobility comments: pt sitting EOB upon PT arrival ?  ? ?Transfers ?Overall transfer level: Needs assistance ?Equipment used: Rolling walker (2 wheels), None ?Transfers: Sit to/from Stand, Bed to chair/wheelchair/BSC ?Sit to Stand: Min guard ?  ?  ?  ?  ?  ?General transfer comment: verbal cues to push up from the bed, not pull up on the walker. pt with good carry over t/o session ?  ? ?Ambulation/Gait ?Ambulation/Gait assistance: Min guard ?Gait Distance (Feet): 150 Feet ?Assistive device: Rolling walker (2 wheels), 1 person hand held assist ?Gait Pattern/deviations: Step-through pattern, Decreased stride length, Decreased dorsiflexion - left, Narrow base of support ?Gait velocity: decreased ?  ?  ?General Gait Details: pt significantly more steady compared to yesterday, no sign of scissoring or shuffling. pt continues with narrow base of support requiring v/c's to correct, pt then with active effort t/o session to maintain increased width of base of support. Pt then trialed with L HHA, pt requiring min/modA, pt more unsteady with noted more narrow base of support and increased L UE pressure ? ? ?Stairs ?  ?  ?  ?  ?  ? ? ?Wheelchair Mobility ?  ? ?Modified Rankin (Stroke Patients Only) ?Modified Rankin (Stroke Patients Only) ?Pre-Morbid Rankin Score: No symptoms ?Modified Rankin: Moderately severe disability ? ? ?  ?  Balance Overall balance assessment: Needs assistance ?Sitting-balance support: No upper extremity supported, Feet supported ?Sitting balance-Leahy Scale: Good ?  ?  ?Standing balance support: During functional activity, Single  extremity supported ?Standing balance-Leahy Scale: Fair ?Standing balance comment: pt requiring external assist for ambulation ?  ?  ?  ?  ?  ?  ?  ?  ?  ?  ?  ?  ? ?  ?Cognition Arousal/Alertness: Awake/alert ?Behavior During Therapy: [facility] for tasks assessed/performed ?Overall Cognitive Status: No family/caregiver present to determine baseline cognitive functioning ?  ?  ?  ?  ?  ?  ?  ?  ?  ?  ?  ?  ?  ?  ?  ?  ?General Comments: pt reports "i feel so much better today". Pt recalls not being able to ambulate very well yesterday. Pt not impulsive and was able to follow commands in appropriate time/not delayed ?  ?  ? ?  ?Exercises Other Exercises ?Other Exercises: worked on squats at EOB without UE support x 10 reps, x2 sets ?Other Exercises: worked on [employer], L weaker/less time compared to R ? ?  ?General Comments General comments (skin integrity, edema, etc.): SpO2 >90% on RA t/o PT session ?  ?  ? ?Pertinent Vitals/Pain Pain Assessment ?Pain Assessment: No/denies pain  ? ? ?Home Living   ?  ?  ?  ?  ?  ?  ?  ?  ?  ?   ?  ?Prior Function    ?  ?  ?   ? ?PT Goals (current goals can now be found in the care plan section) Acute Rehab PT Goals ?Patient Stated Goal: return home ?PT Goal Formulation: With patient ?Time For Goal Achievement: 02/12/22 ?Potential to Achieve Goals: Good ?Progress towards PT goals: Progressing toward goals ? ?  ?Frequency ? ? ? Min 4X/week ? ? ? ?  ?PT Plan Discharge plan needs to be updated  ? ? ?Co-evaluation   ?  ?  ?  ?  ? ?  ?AM-PAC PT "6 Clicks" Mobility   ?Outcome Measure ? Help needed turning from your back to your side while in a flat bed without using bedrails?: None ?Help needed moving from lying on your back to sitting on the side of a flat bed without using bedrails?: A Little ?Help needed moving to and from a bed to a chair (including a wheelchair)?: A Little ?Help needed standing up from a chair using your arms (e.g., wheelchair or bedside chair)?: A Little ?Help needed  to walk in hospital room?: A Little ?Help needed climbing 3-5 steps with a railing? : A Little ?6 Click Score: 19 ? ?  ?End of Session Equipment Utilized During Treatment: Gait belt ?Activity Tolerance: Patient tolerated treatment well ?Patient left: with call bell/phone within reach;in chair;with chair alarm set ?Nurse Communication: Mobility status ?PT Visit Diagnosis: Other abnormalities of gait and mobility (R26.89);Muscle weakness (generalized) (M62.81);Ataxic gait (R26.0) ?  ? ? ?Time: 3557-3220 ?PT Time Calculation (min) (ACUTE ONLY): 27 min ? ?Charges:  $Gait Training: 8-22 mins ?$Therapeutic Exercise: 8-22 mins          ?          ? ?Lewis Shock, PT, DPT ?Acute Rehabilitation Services ?Secure chat preferred ?Office #: [phone] ? ? ? ?Dona Klemann M Janiylah Hannis ?01/31/2022, 1:21 PM ? ?

## 2022-01-31 NOTE — TOC Transition Note (Addendum)
Transition of Care (TOC) - CM/SW Discharge Note ? ? ?Patient Details  ?Name: Angel Carroll ?MRN: [id number] ?Date of Birth: 1970-10-19 ? ?Transition of Care (TOC) CM/SW Contact:  ?Kermit Balo, RN ?Phone Number: ?01/31/2022, 1:11 PM ? ? ?Clinical Narrative:    ?Patient discharging home with outpatient therapy through BPT. Information on the AVS.  ?Walker for home to be delivered to the patients home per [facility] in [place]. Information faxed; [phone] ?Pt did not qualify for home oxygen when ambulating. Pt and and MD updated.  ?Pt has transportation home.  ? ? ?Final next level of care: OP Rehab ?Barriers to Discharge: No Barriers Identified ? ? ?Patient Goals and CMS Choice ?  ?  ?Choice offered to / list presented to : Patient, Adult Children ? ?Discharge Placement ?  ?           ?  ?  ?  ?  ? ?Discharge Plan and Services ?  ?Discharge Planning Services: CM Consult ?           ?DME Arranged: Walker rolling ?  ?Date DME Agency Contacted: 01/31/22 ?  ?Representative spoke with at DME Agency: [facility]: [id number] ?  ?  ?  ?  ?  ? ?Social Determinants of Health (SDOH) Interventions ?  ? ? ?Readmission Risk Interventions ?   ? View : No data to display.  ?  ?  ?  ? ? ? ? ? ?

## 2022-05-11 ENCOUNTER — Other Ambulatory Visit: Payer: Self-pay

## 2022-05-11 NOTE — Patient Outreach (Signed)
[facility]) Care Management  05/11/2022  Angel Carroll 05-16-1971 [id number]   Telephone outreach to patient to obtain mRS was successfully completed by daughter. MRS= 5  Vanice Sarah [facility] Care Management Assistant [phone]
